# Patient Record
Sex: Female | Born: 1960 | Race: Black or African American | Hispanic: No | Marital: Married | Smoking: Never smoker
Health system: Southern US, Community
[De-identification: ages and names within clinical notes are randomized; demographics above are authoritative.]

## PROBLEM LIST (undated history)

## (undated) DIAGNOSIS — I10 Essential (primary) hypertension: Secondary | ICD-10-CM

## (undated) DIAGNOSIS — K219 Gastro-esophageal reflux disease without esophagitis: Secondary | ICD-10-CM

## (undated) DIAGNOSIS — J45909 Unspecified asthma, uncomplicated: Secondary | ICD-10-CM

## (undated) HISTORY — DX: Unspecified asthma, uncomplicated: J45.909

## (undated) HISTORY — DX: Essential (primary) hypertension: I10

## (undated) HISTORY — DX: Gastro-esophageal reflux disease without esophagitis: K21.9

---

## 2009-10-12 ENCOUNTER — Encounter: Admission: RE | Admit: 2009-10-12 | Discharge: 2009-10-12 | Payer: Self-pay | Admitting: Internal Medicine

## 2010-10-27 ENCOUNTER — Other Ambulatory Visit: Payer: Self-pay | Admitting: Internal Medicine

## 2010-10-27 DIAGNOSIS — Z1231 Encounter for screening mammogram for malignant neoplasm of breast: Secondary | ICD-10-CM

## 2010-11-02 ENCOUNTER — Ambulatory Visit
Admission: RE | Admit: 2010-11-02 | Discharge: 2010-11-02 | Disposition: A | Discharge status: Home / Self Care | Payer: 59 | Source: Ambulatory Visit | Attending: Internal Medicine | Admitting: Internal Medicine

## 2010-11-02 DIAGNOSIS — Z1231 Encounter for screening mammogram for malignant neoplasm of breast: Secondary | ICD-10-CM

## 2011-01-19 ENCOUNTER — Other Ambulatory Visit: Payer: Self-pay | Admitting: Internal Medicine

## 2011-01-19 DIAGNOSIS — K219 Gastro-esophageal reflux disease without esophagitis: Secondary | ICD-10-CM

## 2011-01-20 ENCOUNTER — Other Ambulatory Visit: Payer: 59

## 2011-01-28 ENCOUNTER — Ambulatory Visit
Admission: RE | Admit: 2011-01-28 | Discharge: 2011-01-28 | Disposition: A | Discharge status: Home / Self Care | Payer: 59 | Source: Ambulatory Visit | Attending: Internal Medicine | Admitting: Internal Medicine

## 2011-01-28 DIAGNOSIS — K219 Gastro-esophageal reflux disease without esophagitis: Secondary | ICD-10-CM

## 2011-11-15 ENCOUNTER — Other Ambulatory Visit: Payer: Self-pay | Admitting: Internal Medicine

## 2011-11-15 DIAGNOSIS — Z1231 Encounter for screening mammogram for malignant neoplasm of breast: Secondary | ICD-10-CM

## 2011-12-16 ENCOUNTER — Ambulatory Visit
Admission: RE | Admit: 2011-12-16 | Discharge: 2011-12-16 | Disposition: A | Discharge status: Home / Self Care | Payer: 59 | Source: Ambulatory Visit | Attending: Internal Medicine | Admitting: Internal Medicine

## 2011-12-16 DIAGNOSIS — Z1231 Encounter for screening mammogram for malignant neoplasm of breast: Secondary | ICD-10-CM

## 2012-01-05 ENCOUNTER — Ambulatory Visit (INDEPENDENT_AMBULATORY_CARE_PROVIDER_SITE_OTHER): Payer: Self-pay | Admitting: General Surgery

## 2012-01-10 ENCOUNTER — Encounter (INDEPENDENT_AMBULATORY_CARE_PROVIDER_SITE_OTHER): Payer: Self-pay | Admitting: General Surgery

## 2012-01-11 ENCOUNTER — Ambulatory Visit (INDEPENDENT_AMBULATORY_CARE_PROVIDER_SITE_OTHER): Payer: Self-pay | Admitting: General Surgery

## 2012-01-12 ENCOUNTER — Encounter (INDEPENDENT_AMBULATORY_CARE_PROVIDER_SITE_OTHER): Payer: Self-pay | Admitting: General Surgery

## 2012-01-12 ENCOUNTER — Ambulatory Visit (INDEPENDENT_AMBULATORY_CARE_PROVIDER_SITE_OTHER): Payer: 59 | Admitting: General Surgery

## 2012-01-12 VITALS — BP 126/84 | HR 76 | Temp 97.6°F | Resp 14 | Ht 64.0 in | Wt 150.0 lb

## 2012-01-12 DIAGNOSIS — K8020 Calculus of gallbladder without cholecystitis without obstruction: Secondary | ICD-10-CM

## 2012-01-12 NOTE — Patient Instructions (Signed)
Call if you start developing more frequent attacks/symptoms

## 2012-01-12 NOTE — Progress Notes (Signed)
Patient ID: Kelly Brewer Brewer, female   DOB: 1960-06-28, 51 y.o.   MRN: [id number]  Chief Complaint  Patient presents with  . Abdominal Pain    HPI Kelly Brewer Brewer is a 51 y.o. female.   HPI 51 yo female referred by Dr Concepcion Elk for evaluation of abdominal pain. The patient states that she has had intermittent epigastric pain on and off for quite some time. Several months ago, she states it was very intense. Occurring almost on a daily basis. The pain radiated to her back. She denies any fever, chills, nausea, vomiting, diarrhea or constipation. She denies any acholic stools. She denies any difficulty swallowing liquids or solids. She does have some reflux. She had a negative upper endoscopy. She denies any NSAID use. She states that she stopped taking a lot of medication and since then she has had very few attacks. Over the past month she really hasn't had any episodes. She has had one or 2 episodes where she had only some mild discomfort in her epigastrium that lasted for a few minutes. She saw her primary care physician recently and was complaining of some back pain in addition to the abdominal pain. She was given some muscle relaxant which has relieved her back pain.  She states that she will have the epigastric discomfort generally after eating foods. Past Medical History  Diagnosis Date  . GERD (gastroesophageal reflux disease)   . Hypertension   . Asthma     Past Surgical History  Procedure Date  . Cesarean section     No family history on file.  Social History History  Substance Use Topics  . Smoking status: Never Smoker   . Smokeless tobacco: Not on file  . Alcohol Use: No    No Known Allergies  Current Outpatient Prescriptions  Medication Sig Dispense Refill  . albuterol (PROVENTIL) (2.5 MG/3ML) 0.083% nebulizer solution Take 2.5 mg by nebulization every 6 (six) hours as needed.      . cyclobenzaprine (FLEXERIL) 5 MG tablet Take 5 mg by mouth 3 (three) times daily as  needed.      Marland Kitchen losartan (COZAAR) 100 MG tablet Take 100 mg by mouth daily.      . naproxen (NAPROSYN) 500 MG tablet Take 500 mg by mouth 2 (two) times daily with a meal.      . loratadine (CLARITIN) 10 MG tablet Take 10 mg by mouth daily.        Review of Systems Review of Systems  Constitutional: Negative for fever, activity change, appetite change and unexpected weight change.  HENT: Negative for hearing loss and neck pain.   Eyes: Negative for photophobia, discharge and visual disturbance.  Respiratory: Negative for chest tightness and shortness of breath.   Cardiovascular: Negative for chest pain, palpitations and leg swelling.  Gastrointestinal:       See hpi  Genitourinary: Negative for dysuria, hematuria, difficulty urinating and genital sores.  Musculoskeletal: Positive for back pain. Negative for joint swelling and arthralgias.  Skin: Negative for color change, pallor and rash.  Neurological: Negative for dizziness, tremors, seizures, weakness, light-headedness and headaches.  Hematological: Negative for adenopathy. Does not bruise/bleed easily.  Psychiatric/Behavioral: Negative for behavioral problems. The patient is not nervous/anxious and is not hyperactive.     Blood pressure 126/84, pulse 76, temperature 97.6 F (36.4 C), resp. rate 14, height 5\' 4"  (1.626 m), weight 150 lb (68.04 kg).  Physical Exam Physical Exam  Vitals reviewed. Constitutional: She is oriented to person, place, and  time. She appears well-developed and well-nourished. No distress.  HENT:  Head: Normocephalic and atraumatic.  Right Ear: External ear normal.  Left Ear: External ear normal.  Eyes: Conjunctivae normal are normal. No scleral icterus.  Neck: Normal range of motion. Neck supple. No tracheal deviation present. No thyromegaly present.  Cardiovascular: Normal rate, regular rhythm and normal heart sounds.   Pulmonary/Chest: Effort normal and breath sounds normal. No respiratory distress. She  has no wheezes.  Abdominal: Soft. Bowel sounds are normal. She exhibits no distension. There is no tenderness. There is no rebound and no guarding.    Musculoskeletal: She exhibits no edema and no tenderness.  Neurological: She is alert and oriented to person, place, and time.  Skin: Skin is warm and dry. No rash noted. She is not diaphoretic. No erythema.  Psychiatric: She has a normal mood and affect. Her behavior is normal. Thought content normal.    Data Reviewed -Referring provider's note from 12/2011 -Dr Haywood Pao brief note to PCP in 02/2011 - nml EGD  COMPLETE ABDOMINAL ULTRASOUND 2012 Comparison: None.  Findings:  Gallbladder: There are multiple mobile echogenic layering stones  with an otherwise normal-appearing gallbladder. No gallbladder  wall thickening or pericholecystic fluid. Negative sonographic  Murphy's sign.  Common bile duct: Normal in size measuring 3.2 mm in diameter.  Liver: Homogeneous hepatic echotexture. No discrete hepatic  lesions. No ascites.  IVC: Appears normal.  Pancreas: There is mild heterogeneity of the pancreatic parenchyma  without discrete lesion.  Spleen: Normal in size measuring 5.2 mm in diameter.  Right Kidney: Normal cortical thickness, echogenicity and size,  measuring 9.5 cm in length. No focal renal lesions. No echogenic  renal stones. No urinary obstruction.  Left Kidney: Normal cortical thickness, echogenicity and size,  measuring 10.4 cm in length. No focal renal lesions. No echogenic  renal stones. No urinary obstruction.  Abdominal aorta: No aneurysm identified.   IMPRESSION:  Cholelithiasis without evidence of cholecystitis. Further  evaluation may be obtained with a HIDA scan as clinically  indicated.   Assessment    Symptomatic cholelithiasis    Plan    I believe the patient's symptoms are consistent with gallbladder disease.  We discussed gallbladder disease. The patient was given Agricultural engineer. We discussed  non-operative and operative management. We discussed the signs & symptoms of acute cholecystitis  I discussed laparoscopic cholecystectomy with IOC in detail.  The patient was given educational material as well as diagrams detailing the procedure.  We discussed the risks and benefits of a laparoscopic cholecystectomy including, but not limited to bleeding, infection, injury to surrounding structures such as the intestine or liver, bile leak, retained gallstones, need to convert to an open procedure, prolonged diarrhea, blood clots such as  DVT, common bile duct injury, anesthesia risks, and possible need for additional procedures.  We discussed the typical post-operative recovery course. I explained that the likelihood of improvement of their symptoms is good.  The patient has elected NOT to proceed with Laparoscopic Cholecystectomy at this time. They want to wait until she starts having more frequent attacks. I encouraged them to call us if she starts developing more frequent attacks. I also explained that the risks of complications is increased when the gallbladder is inflammed. We rediscussed signs & symptoms of acute cholecystitis.   F/u PRN  Kelly Brewer Brewer. Andrey Campanile, MD, FACS General, Bariatric, & Minimally Invasive Surgery [facility] Surgery, [place]         [facility] M 01/12/2012, 2:27 PM

## 2012-01-18 ENCOUNTER — Encounter (INDEPENDENT_AMBULATORY_CARE_PROVIDER_SITE_OTHER): Payer: Self-pay

## 2012-06-06 ENCOUNTER — Encounter (INDEPENDENT_AMBULATORY_CARE_PROVIDER_SITE_OTHER): Payer: Self-pay

## 2012-11-09 ENCOUNTER — Other Ambulatory Visit: Payer: Self-pay

## 2012-11-09 DIAGNOSIS — Z1231 Encounter for screening mammogram for malignant neoplasm of breast: Secondary | ICD-10-CM

## 2012-11-22 ENCOUNTER — Encounter (INDEPENDENT_AMBULATORY_CARE_PROVIDER_SITE_OTHER): Payer: Self-pay | Admitting: General Surgery

## 2012-11-22 ENCOUNTER — Ambulatory Visit (INDEPENDENT_AMBULATORY_CARE_PROVIDER_SITE_OTHER): Payer: 59 | Admitting: General Surgery

## 2012-11-22 VITALS — BP 180/100 | HR 72 | Temp 98.7°F | Resp 14 | Ht 64.0 in | Wt 155.7 lb

## 2012-11-22 DIAGNOSIS — K8020 Calculus of gallbladder without cholecystitis without obstruction: Secondary | ICD-10-CM

## 2012-11-22 NOTE — Progress Notes (Signed)
Patient ID: Kelly Brewer, female   DOB: Jan 27, 1961, 52 y.o.   MRN: [id number]  Chief Complaint  Patient presents with  . Follow-up    LTFU reck gb and discuss sx    HPI Kelly Brewer is a 52 y.o. female.   HPI 52 year old African female comes back in because of ongoing epigastric abdominal pain radiating to her back and shoulder. I initially saw her last December for symptomatic cholelithiasis. She had a normal upper endoscopy. At that time her symptoms and her attacks were infrequent and they decided to do diet modification. She reports recently that her episodes are coming more frequently. She states that she has an attack about every 2 days. It will last for about 30 minutes to over an hour. She describes it as a sharp pain that occurs in her upper abdomen radiating to her back. She denies any nausea or vomiting. She does endorse some bloating. She denies any fevers or chills. She denies any diarrhea or constipation. She denies any NSAID use. She tried taking Nexium without any relief. She stopped taking her blood pressure medicine several weeks ago.  Past Medical History  Diagnosis Date  . GERD (gastroesophageal reflux disease)   . Hypertension   . Asthma     Past Surgical History  Procedure Laterality Date  . Cesarean section      History reviewed. No pertinent family history.  Social History History  Substance Use Topics  . Smoking status: Never Smoker   . Smokeless tobacco: Never Used  . Alcohol Use: No    No Known Allergies  Current Outpatient Prescriptions  Medication Sig Dispense Refill  . albuterol (PROVENTIL) (2.5 MG/3ML) 0.083% nebulizer solution Take 2.5 mg by nebulization every 6 (six) hours as needed.      Marland Kitchen NEXIUM 40 MG capsule        No current facility-administered medications for this visit.    Review of Systems Review of Systems  Constitutional: Negative for fever, activity change, appetite change and unexpected weight change.  HENT:  Negative for nosebleeds and trouble swallowing.   Eyes: Negative for photophobia and visual disturbance.  Respiratory: Negative for chest tightness and shortness of breath.   Cardiovascular: Negative for chest pain and leg swelling.       Denies CP, SOB, orthopnea, PND, DOE  Gastrointestinal:       See HPI  Genitourinary: Negative for dysuria and difficulty urinating.  Musculoskeletal: Negative for arthralgias.  Skin: Negative for pallor and rash.  Neurological: Negative for dizziness, seizures, facial asymmetry and numbness.       Denies TIA and amaurosis fugax   Hematological: Negative for adenopathy. Does not bruise/bleed easily.  Psychiatric/Behavioral: Negative for behavioral problems and agitation.    Blood pressure 180/100, pulse 72, temperature 98.7 F (37.1 C), temperature source Temporal, resp. rate 14, height 5\' 4"  (1.626 m), weight 155 lb 10.6 oz (70.607 kg).  Physical Exam Physical Exam  Vitals reviewed. Constitutional: She is oriented to person, place, and time. She appears well-developed and well-nourished. No distress.  HENT:  Head: Normocephalic and atraumatic.  Right Ear: External ear normal.  Left Ear: External ear normal.  Eyes: Conjunctivae are normal. No scleral icterus.  Neck: Normal range of motion. Neck supple. No tracheal deviation present. No thyromegaly present.  Cardiovascular: Normal rate and normal heart sounds.   Pulmonary/Chest: Effort normal and breath sounds normal. No stridor. No respiratory distress. She has no wheezes.  Abdominal: Soft. She exhibits no distension. There is no  tenderness. There is no rebound and no guarding.  Musculoskeletal: She exhibits no edema and no tenderness.  Lymphadenopathy:    She has no cervical adenopathy.  Neurological: She is alert and oriented to person, place, and time. She exhibits normal muscle tone.  Skin: Skin is warm and dry. No rash noted. She is not diaphoretic. No erythema.  Psychiatric: She has a  normal mood and affect. Her behavior is normal. Judgment and thought content normal.  She deferred to her husband    Data Reviewed My office note from 01/2012 U/s report  Assessment    Symptomatic cholelithiasis HTN     Plan    First, I informed that she must restart her blood pressure medication. We discussed the dangers of untreated HTN. She has no symptoms of HA, vision changes.   I believe the patient's symptoms are consistent with gallbladder disease.  We rediscussed gallbladder disease. The patient was given Agricultural engineer. We rediscussed non-operative and operative management. We discussed the signs & symptoms of acute cholecystitis  I rediscussed laparoscopic cholecystectomy with possible IOC in detail.  The patient was given educational material as well as diagrams detailing the procedure.  We discussed the risks and benefits of a laparoscopic cholecystectomy including, but not limited to bleeding, infection, injury to surrounding structures such as the intestine or liver, bile leak, retained gallstones, need to convert to an open procedure, prolonged diarrhea, blood clots such as  DVT, common bile duct injury, anesthesia risks, and possible need for additional procedures.  We discussed the typical post-operative recovery course. I explained that the likelihood of improvement of their symptoms is good.  They have elected to proceed with surgery  Mary Sella. Andrey Campanile, MD, FACS General, Bariatric, & Minimally Invasive Surgery [facility] Surgery, [place]          [facility] M 11/22/2012, 3:56 PM

## 2012-11-22 NOTE — Patient Instructions (Signed)
Resume your blood pressure medication  Avoid fried, greasy foods  Cholelithiasis Cholelithiasis (also called gallstones) is a form of gallbladder disease where gallstones form in your gallbladder. The gallbladder is a non-essential organ that stores bile made in the liver, which helps digest fats. Gallstones begin as small crystals and slowly grow into stones. Gallstone pain occurs when the gallbladder spasms, and a gallstone is blocking the duct. Pain can also occur when a stone passes out of the duct.  Women are more likely to develop gallstones than men. Other factors that increase the risk of gallbladder disease are:  Having multiple pregnancies. Physicians sometimes advise removing diseased gallbladders before future pregnancies.  Obesity.  Diets heavy in fried foods and fat.  Increasing age (older than 30).  Prolonged use of medications containing female hormones.  Diabetes mellitus.  Rapid weight loss.  Family history of gallstones (heredity). SYMPTOMS  Feeling sick to your stomach (nauseous).  Abdominal pain.  Yellowing of the skin (jaundice).  Sudden pain. It may persist from several minutes to several hours.  Worsening pain with deep breathing or when jarred.  Fever.  Tenderness to the touch. In some cases, when gallstones do not move into the bile duct, people have no pain or symptoms. These are called "silent" gallstones. TREATMENT In severe cases, emergency surgery may be required. HOME CARE INSTRUCTIONS   Only take over-the-counter or prescription medicines for pain, discomfort, or fever as directed by your caregiver.  Follow a low-fat diet until seen again. Fat causes the gallbladder to contract, which can result in pain.  Follow up as instructed. Attacks are almost always recurrent and surgery is usually required for permanent treatment. SEEK IMMEDIATE MEDICAL CARE IF:   Your pain increases and is not controlled by medications.  You have an oral  temperature above 102 F (38.9 C), not controlled by medication.  You develop nausea and vomiting. MAKE SURE YOU:   Understand these instructions.  Will watch your condition.  Will get help right away if you are not doing well or get worse. Document Released: 01/13/2005 Document Revised: 04/11/2011 Document Reviewed: 03/18/2010 [facility] Patient Information 2014 [place], [place].

## 2012-12-17 ENCOUNTER — Ambulatory Visit
Admission: RE | Admit: 2012-12-17 | Discharge: 2012-12-17 | Disposition: A | Discharge status: Home / Self Care | Payer: 59 | Source: Ambulatory Visit

## 2012-12-17 DIAGNOSIS — Z1231 Encounter for screening mammogram for malignant neoplasm of breast: Secondary | ICD-10-CM

## 2012-12-31 ENCOUNTER — Encounter (HOSPITAL_COMMUNITY): Payer: Self-pay

## 2012-12-31 ENCOUNTER — Encounter (HOSPITAL_COMMUNITY)
Admission: RE | Admit: 2012-12-31 | Discharge: 2012-12-31 | Disposition: A | Discharge status: Home / Self Care | Payer: 59 | Source: Ambulatory Visit | Attending: General Surgery | Admitting: General Surgery

## 2012-12-31 DIAGNOSIS — Z01818 Encounter for other preprocedural examination: Secondary | ICD-10-CM | POA: Insufficient documentation

## 2012-12-31 DIAGNOSIS — Z01812 Encounter for preprocedural laboratory examination: Secondary | ICD-10-CM | POA: Insufficient documentation

## 2012-12-31 LAB — CBC WITH DIFFERENTIAL/PLATELET
Eosinophils Absolute: 0.4 10*3/uL (ref 0.0–0.7)
Eosinophils Relative: 7 % — ABNORMAL HIGH (ref 0–5)
HCT: 38.2 % (ref 36.0–46.0)
Lymphocytes Relative: 46 % (ref 12–46)
Lymphs Abs: 2.5 10*3/uL (ref 0.7–4.0)
MCH: 26.9 pg (ref 26.0–34.0)
MCV: 77.3 fL — ABNORMAL LOW (ref 78.0–100.0)
Monocytes Absolute: 0.4 10*3/uL (ref 0.1–1.0)
Platelets: 211 10*3/uL (ref 150–400)
RBC: 4.94 MIL/uL (ref 3.87–5.11)
RDW: 13 % (ref 11.5–15.5)
WBC: 5.4 10*3/uL (ref 4.0–10.5)

## 2012-12-31 LAB — COMPREHENSIVE METABOLIC PANEL
CO2: 27 mEq/L (ref 19–32)
Calcium: 9.5 mg/dL (ref 8.4–10.5)
Creatinine, Ser: 0.92 mg/dL (ref 0.50–1.10)
GFR calc Af Amer: 83 mL/min — ABNORMAL LOW (ref >90)
GFR calc non Af Amer: 71 mL/min — ABNORMAL LOW (ref >90)
Glucose, Bld: 78 mg/dL (ref 70–99)
Total Protein: 7.9 g/dL (ref 6.0–8.3)

## 2012-12-31 LAB — HCG, SERUM, QUALITATIVE: Preg, Serum: NEGATIVE

## 2012-12-31 NOTE — Progress Notes (Addendum)
req'd ekg,cxr from pcp dr [facility]

## 2012-12-31 NOTE — Pre-Procedure Instructions (Addendum)
ABBAGAIL SCAFF  12/31/2012   Your procedure is scheduled on:  01/11/13  Report to [facility] short stay admitting at 800 AM.  Call this number if you have problems the morning of surgery: [phone]   Remember:   Do not eat food or drink liquids after midnight.   Take these medicines the morning of surgery with A SIP water, inhaler if needed              STOP all herbel meds, nsaids (aleve,naproxen,advil,ibuprofen) 5 days prior to surgery including aspirin   Do not wear jewelry, make-up or nail polish.  Do not wear lotions, powders, or perfumes. You may wear deodorant.  Do not shave 48 hours prior to surgery. Men may shave face and neck.  Do not bring valuables to the hospital.  [facility] is not responsible                  for any belongings or valuables.               Contacts, dentures or bridgework may not be worn into surgery.  Leave suitcase in the car. After surgery it may be brought to your room.  For patients admitted to the hospital, discharge time is determined by your                treatment team.               Patients discharged the day of surgery will not be allowed to drive  home.  Name and phone number of your driver:   Special Instructions: Shower using CHG 2 nights before surgery and the night before surgery.  If you shower the day of surgery use CHG.  Use special wash - you have one bottle of CHG for all showers.  You should use approximately 1/3 of the bottle for each shower.   Please read over the following fact sheets that you were given: Pain Booklet, Coughing and Deep Breathing and Surgical Site Infection Prevention

## 2013-01-10 MED ORDER — CEFOXITIN SODIUM 2 G IV SOLR
2.0000 g | INTRAVENOUS | Status: AC
  Administered 2013-01-11: 2 g via INTRAVENOUS
  Filled 2013-01-10: qty 2

## 2013-01-11 ENCOUNTER — Encounter (HOSPITAL_COMMUNITY)
Admission: RE | Disposition: A | Discharge status: Home / Self Care | Payer: Self-pay | Source: Ambulatory Visit | Attending: General Surgery

## 2013-01-11 ENCOUNTER — Encounter (HOSPITAL_COMMUNITY): Payer: Self-pay | Admitting: Certified Registered"

## 2013-01-11 ENCOUNTER — Encounter (HOSPITAL_COMMUNITY): Payer: 59 | Admitting: Certified Registered"

## 2013-01-11 ENCOUNTER — Ambulatory Visit (HOSPITAL_COMMUNITY): Payer: 59 | Admitting: Certified Registered"

## 2013-01-11 ENCOUNTER — Ambulatory Visit (HOSPITAL_COMMUNITY): Payer: 59

## 2013-01-11 ENCOUNTER — Ambulatory Visit (HOSPITAL_COMMUNITY)
Admission: RE | Admit: 2013-01-11 | Discharge: 2013-01-11 | Disposition: A | Discharge status: Home / Self Care | Payer: 59 | Source: Ambulatory Visit | Attending: General Surgery | Admitting: General Surgery

## 2013-01-11 DIAGNOSIS — K8010 Calculus of gallbladder with chronic cholecystitis without obstruction: Secondary | ICD-10-CM

## 2013-01-11 DIAGNOSIS — I10 Essential (primary) hypertension: Secondary | ICD-10-CM | POA: Insufficient documentation

## 2013-01-11 HISTORY — PX: CHOLECYSTECTOMY: SHX55

## 2013-01-11 SURGERY — LAPAROSCOPIC CHOLECYSTECTOMY WITH INTRAOPERATIVE CHOLANGIOGRAM
Anesthesia: General | Site: Abdomen

## 2013-01-11 MED ORDER — KETOROLAC TROMETHAMINE 30 MG/ML IJ SOLN
INTRAMUSCULAR | Status: AC
  Administered 2013-01-11: 30 mg via INTRAVENOUS
  Filled 2013-01-11: qty 1

## 2013-01-11 MED ORDER — LIDOCAINE HCL (CARDIAC) 20 MG/ML IV SOLN
INTRAVENOUS | Status: DC | PRN
  Administered 2013-01-11: 80 mg via INTRAVENOUS

## 2013-01-11 MED ORDER — HYDROMORPHONE HCL PF 1 MG/ML IJ SOLN
INTRAMUSCULAR | Status: AC
  Filled 2013-01-11: qty 1

## 2013-01-11 MED ORDER — ONDANSETRON HCL 4 MG/2ML IJ SOLN: 4.0000 mg | Freq: Four times a day (QID) | INTRAMUSCULAR | Status: DC | PRN

## 2013-01-11 MED ORDER — ROCURONIUM BROMIDE 100 MG/10ML IV SOLN
INTRAVENOUS | Status: DC | PRN
  Administered 2013-01-11: 50 mg via INTRAVENOUS

## 2013-01-11 MED ORDER — NEOSTIGMINE METHYLSULFATE 1 MG/ML IJ SOLN
INTRAMUSCULAR | Status: DC | PRN
  Administered 2013-01-11: 1 mg via INTRAVENOUS
  Administered 2013-01-11: 4 mg via INTRAVENOUS

## 2013-01-11 MED ORDER — SIMETHICONE 40 MG/0.6ML PO SUSP
40.0000 mg | Freq: Once | ORAL | Status: DC
  Filled 2013-01-11: qty 0.6

## 2013-01-11 MED ORDER — GLYCOPYRROLATE 0.2 MG/ML IJ SOLN
INTRAMUSCULAR | Status: DC | PRN
  Administered 2013-01-11: 0.6 mg via INTRAVENOUS
  Administered 2013-01-11: 0.2 mg via INTRAVENOUS

## 2013-01-11 MED ORDER — FENTANYL CITRATE 0.05 MG/ML IJ SOLN
INTRAMUSCULAR | Status: DC | PRN
  Administered 2013-01-11: 100 ug via INTRAVENOUS
  Administered 2013-01-11: 50 ug via INTRAVENOUS

## 2013-01-11 MED ORDER — OXYCODONE HCL 5 MG/5ML PO SOLN: 5.0000 mg | Freq: Once | ORAL | Status: DC | PRN

## 2013-01-11 MED ORDER — ONDANSETRON HCL 4 MG/2ML IJ SOLN
INTRAMUSCULAR | Status: DC | PRN
  Administered 2013-01-11: 4 mg via INTRAVENOUS

## 2013-01-11 MED ORDER — KETOROLAC TROMETHAMINE 30 MG/ML IJ SOLN
30.0000 mg | Freq: Four times a day (QID) | INTRAMUSCULAR | Status: DC
  Administered 2013-01-11: 30 mg via INTRAVENOUS
  Filled 2013-01-11 (×3): qty 1

## 2013-01-11 MED ORDER — MIDAZOLAM HCL 5 MG/5ML IJ SOLN
INTRAMUSCULAR | Status: DC | PRN
  Administered 2013-01-11 (×2): 2 mg via INTRAVENOUS

## 2013-01-11 MED ORDER — ESMOLOL HCL 10 MG/ML IV SOLN
INTRAVENOUS | Status: DC | PRN
  Administered 2013-01-11: 20 mg via INTRAVENOUS

## 2013-01-11 MED ORDER — HYDROMORPHONE HCL PF 1 MG/ML IJ SOLN
INTRAMUSCULAR | Status: AC
  Administered 2013-01-11: 0.25 mg via INTRAVENOUS
  Filled 2013-01-11: qty 1

## 2013-01-11 MED ORDER — PROPOFOL 10 MG/ML IV BOLUS
INTRAVENOUS | Status: DC | PRN
  Administered 2013-01-11: 190 mg via INTRAVENOUS

## 2013-01-11 MED ORDER — HYDROMORPHONE HCL PF 1 MG/ML IJ SOLN
0.2500 mg | INTRAMUSCULAR | Status: DC | PRN
  Administered 2013-01-11 (×2): 0.25 mg via INTRAVENOUS

## 2013-01-11 MED ORDER — OXYCODONE-ACETAMINOPHEN 5-325 MG PO TABS: 1.0000 | ORAL_TABLET | ORAL | Status: DC | PRN

## 2013-01-11 MED ORDER — 0.9 % SODIUM CHLORIDE (POUR BTL) OPTIME
TOPICAL | Status: DC | PRN
  Administered 2013-01-11: 1000 mL

## 2013-01-11 MED ORDER — LACTATED RINGERS IV SOLN
INTRAVENOUS | Status: DC
  Administered 2013-01-11 (×2): via INTRAVENOUS

## 2013-01-11 MED ORDER — OXYCODONE HCL 5 MG PO TABS: 5.0000 mg | ORAL_TABLET | Freq: Once | ORAL | Status: DC | PRN

## 2013-01-11 MED ORDER — PHENYLEPHRINE HCL 10 MG/ML IJ SOLN
INTRAMUSCULAR | Status: DC | PRN
  Administered 2013-01-11 (×5): 80 ug via INTRAVENOUS
  Administered 2013-01-11: 120 ug via INTRAVENOUS
  Administered 2013-01-11: 40 ug via INTRAVENOUS
  Administered 2013-01-11: 120 ug via INTRAVENOUS

## 2013-01-11 MED ORDER — SODIUM CHLORIDE 0.9 % IR SOLN
Status: DC | PRN
  Administered 2013-01-11: 1000 mL

## 2013-01-11 MED ORDER — BUPIVACAINE-EPINEPHRINE (PF) 0.25% -1:200000 IJ SOLN
INTRAMUSCULAR | Status: AC
  Filled 2013-01-11: qty 30

## 2013-01-11 MED ORDER — BUPIVACAINE-EPINEPHRINE 0.25% -1:200000 IJ SOLN
INTRAMUSCULAR | Status: DC | PRN
  Administered 2013-01-11: 22 mL

## 2013-01-11 SURGICAL SUPPLY — 47 items
APPLIER CLIP 5 13 M/L LIGAMAX5 (MISCELLANEOUS) ×2
BANDAGE ADHESIVE 1X3 (GAUZE/BANDAGES/DRESSINGS) ×6 IMPLANT
BENZOIN TINCTURE PRP APPL 2/3 (GAUZE/BANDAGES/DRESSINGS) ×2 IMPLANT
BLADE SURG ROTATE 9660 (MISCELLANEOUS) IMPLANT
CANISTER SUCTION 2500CC (MISCELLANEOUS) ×2 IMPLANT
CHLORAPREP W/TINT 26ML (MISCELLANEOUS) ×2 IMPLANT
CLIP APPLIE 5 13 M/L LIGAMAX5 (MISCELLANEOUS) ×1 IMPLANT
COVER MAYO STAND STRL (DRAPES) ×2 IMPLANT
COVER SURGICAL LIGHT HANDLE (MISCELLANEOUS) ×2 IMPLANT
DECANTER SPIKE VIAL GLASS SM (MISCELLANEOUS) ×2 IMPLANT
DRAPE C-ARM 42X72 X-RAY (DRAPES) ×2 IMPLANT
DRAPE UTILITY 15X26 W/TAPE STR (DRAPE) ×4 IMPLANT
DRSG TEGADERM 4X4.75 (GAUZE/BANDAGES/DRESSINGS) ×2 IMPLANT
ELECT REM PT RETURN 9FT ADLT (ELECTROSURGICAL) ×2
ELECTRODE REM PT RTRN 9FT ADLT (ELECTROSURGICAL) ×1 IMPLANT
GAUZE SPONGE 2X2 8PLY STRL LF (GAUZE/BANDAGES/DRESSINGS) ×1 IMPLANT
GLOVE BIOGEL M STRL SZ7.5 (GLOVE) ×2 IMPLANT
GLOVE BIOGEL PI IND STRL 7.0 (GLOVE) ×1 IMPLANT
GLOVE BIOGEL PI IND STRL 8 (GLOVE) ×1 IMPLANT
GLOVE BIOGEL PI INDICATOR 7.0 (GLOVE) ×1
GLOVE BIOGEL PI INDICATOR 8 (GLOVE) ×1
GLOVE SS BIOGEL STRL SZ 6 (GLOVE) ×1 IMPLANT
GLOVE SS BIOGEL STRL SZ 7 (GLOVE) ×1 IMPLANT
GLOVE SUPERSENSE BIOGEL SZ 6 (GLOVE) ×1
GLOVE SUPERSENSE BIOGEL SZ 7 (GLOVE) ×1
GLOVE SURG SS PI 7.0 STRL IVOR (GLOVE) ×2 IMPLANT
GOWN STRL NON-REIN LRG LVL3 (GOWN DISPOSABLE) ×4 IMPLANT
GOWN STRL REIN XL XLG (GOWN DISPOSABLE) ×2 IMPLANT
KIT BASIN OR (CUSTOM PROCEDURE TRAY) ×2 IMPLANT
KIT ROOM TURNOVER OR (KITS) ×2 IMPLANT
NS IRRIG 1000ML POUR BTL (IV SOLUTION) ×2 IMPLANT
PAD ARMBOARD 7.5X6 YLW CONV (MISCELLANEOUS) ×2 IMPLANT
POUCH SPECIMEN RETRIEVAL 10MM (ENDOMECHANICALS) ×2 IMPLANT
SCISSORS LAP 5X35 DISP (ENDOMECHANICALS) ×2 IMPLANT
SET CHOLANGIOGRAPH 5 50 .035 (SET/KITS/TRAYS/PACK) ×4 IMPLANT
SET IRRIG TUBING LAPAROSCOPIC (IRRIGATION / IRRIGATOR) ×2 IMPLANT
SLEEVE ENDOPATH XCEL 5M (ENDOMECHANICALS) ×4 IMPLANT
SPECIMEN JAR SMALL (MISCELLANEOUS) ×2 IMPLANT
SPONGE GAUZE 2X2 STER 10/PKG (GAUZE/BANDAGES/DRESSINGS) ×1
SUT MNCRL AB 4-0 PS2 18 (SUTURE) ×2 IMPLANT
SUT SILK 3 0 SH CR/8 (SUTURE) ×2 IMPLANT
SUT VICRYL 0 UR6 27IN ABS (SUTURE) ×4 IMPLANT
TOWEL OR 17X24 6PK STRL BLUE (TOWEL DISPOSABLE) ×2 IMPLANT
TOWEL OR 17X26 10 PK STRL BLUE (TOWEL DISPOSABLE) ×2 IMPLANT
TRAY LAPAROSCOPIC (CUSTOM PROCEDURE TRAY) ×2 IMPLANT
TROCAR XCEL BLUNT TIP 100MML (ENDOMECHANICALS) ×2 IMPLANT
TROCAR XCEL NON-BLD 5MMX100MML (ENDOMECHANICALS) ×2 IMPLANT

## 2013-01-11 NOTE — Progress Notes (Signed)
Attempted to call report to Short stay, pt stable and ready to be transferred to short stay

## 2013-01-11 NOTE — Anesthesia Procedure Notes (Signed)
Procedure Name: Intubation Date/Time: 01/11/2013 9:42 AM Performed by: Jerilee Hoh Pre-anesthesia Checklist: Patient identified, Emergency Drugs available, Suction available and Patient being monitored Patient Re-evaluated:Patient Re-evaluated prior to inductionOxygen Delivery Method: Circle system utilized Preoxygenation: Pre-oxygenation with 100% oxygen Intubation Type: IV induction Ventilation: Mask ventilation without difficulty Laryngoscope Size: Mac and 3 Grade View: Grade I Tube type: Oral Tube size: 7.0 mm Number of attempts: 1 Airway Equipment and Method: Stylet Placement Confirmation: ETT inserted through vocal cords under direct vision,  positive ETCO2 and breath sounds checked- equal and bilateral Secured at: 22 cm Tube secured with: Tape Dental Injury: Teeth and Oropharynx as per pre-operative assessment

## 2013-01-11 NOTE — Progress Notes (Signed)
Attempted to call report to Short stay again, still not able to take patient at this time, pt resting in bed, will cont to assess

## 2013-01-11 NOTE — Progress Notes (Signed)
Report called to short stay Tanya RN, transferring patient to [facility] via strecher, husband at bedside

## 2013-01-11 NOTE — Preoperative (Signed)
Beta Blockers   Reason not to administer Beta Blockers:Not Applicable 

## 2013-01-11 NOTE — H&P (Signed)
Kelly Brewer is an 52 y.o. female.   Chief Complaint: abdominal pain, here for surgery HPI: 52 yo African female here for surgery  In review, 52 year old African female comes back in because of ongoing epigastric abdominal pain radiating to her back and shoulder. I initially saw her last December for symptomatic cholelithiasis. She had a normal upper endoscopy. At that time her symptoms and her attacks were infrequent and they decided to do diet modification. She reports recently that her episodes are coming more frequently. She states that she has an attack about every 2 days. It will last for about 30 minutes to over an hour. She describes it as a sharp pain that occurs in her upper abdomen radiating to her back. She denies any nausea or vomiting. She does endorse some bloating. She denies any fevers or chills. She denies any diarrhea or constipation. She denies any NSAID use. She tried taking Nexium without any relief. She stopped taking her blood pressure medicine several weeks ago.   Past Medical History  Diagnosis Date  . GERD (gastroesophageal reflux disease)   . Hypertension   . Asthma     Past Surgical History  Procedure Laterality Date  . Cesarean section      History reviewed. No pertinent family history. Social History:  reports that she has never smoked. She has never used smokeless tobacco. She reports that she does not drink alcohol or use illicit drugs.  Allergies:  Allergies  Allergen Reactions  . Other Other (See Comments)    AGENT: Camaquin REACTION: Nausea, weakness    Medications Prior to Admission  Medication Sig Dispense Refill  . amLODipine (NORVASC) 10 MG tablet Take 10 mg by mouth daily.      Marland Kitchen etonogestrel (IMPLANON) 68 MG IMPL implant Inject 1 each into the skin once.      Marland Kitchen losartan (COZAAR) 100 MG tablet Take 100 mg by mouth daily.      Marland Kitchen albuterol (PROVENTIL HFA;VENTOLIN HFA) 108 (90 BASE) MCG/ACT inhaler Inhale 2 puffs into the lungs every 6  (six) hours as needed for wheezing or shortness of breath.        No results found for this or any previous visit (from the past 48 hour(s)). No results found.  Review of Systems  Constitutional: Negative for weight loss.  HENT: Negative for nosebleeds.   Eyes: Negative for blurred vision.  Respiratory: Negative for shortness of breath.   Cardiovascular: Negative for chest pain, palpitations, orthopnea and PND.       Denies DOE  Gastrointestinal: Positive for nausea and abdominal pain. Negative for diarrhea and constipation.  Genitourinary: Negative for dysuria and hematuria.  Musculoskeletal: Negative.   Skin: Negative for itching and rash.  Neurological: Negative for dizziness, focal weakness, seizures, loss of consciousness and headaches.       Denies TIAs, amaurosis fugax  Endo/Heme/Allergies: Does not bruise/bleed easily.  Psychiatric/Behavioral: The patient is not nervous/anxious.     Blood pressure 145/98, pulse 87, temperature 98.7 F (37.1 C), temperature source Oral, resp. rate 18, last menstrual period 12/12/2012, SpO2 100.00%. Physical Exam  Vitals reviewed. Constitutional: She is oriented to person, place, and time. She appears well-developed and well-nourished. No distress.  HENT:  Head: Normocephalic and atraumatic.  Right Ear: External ear normal.  Left Ear: External ear normal.  Eyes: Conjunctivae are normal. No scleral icterus.  Neck: Normal range of motion. No tracheal deviation present.  Cardiovascular: Normal rate and normal heart sounds.   Respiratory: Effort normal. No  stridor. No respiratory distress. She has no wheezes.  GI: Soft. She exhibits no distension. There is no tenderness.  Musculoskeletal: She exhibits no edema.  Neurological: She is alert and oriented to person, place, and time.  Skin: Skin is warm and dry. No rash noted. She is not diaphoretic. No erythema. No pallor.  Psychiatric: She has a normal mood and affect. Her behavior is normal.  Judgment and thought content normal.     Assessment/Plan Symptomatic cholelithiaisis  To or for Lap chole with IOC All questions asked and answered. Watched EMMI video  Mary Sella. Andrey Campanile, MD, FACS General, Bariatric, & Minimally Invasive Surgery [facility] Surgery, [place]   [facility] M 01/11/2013, 9:30 AM

## 2013-01-11 NOTE — Anesthesia Preprocedure Evaluation (Signed)
Anesthesia Evaluation  Patient identified by MRN, date of birth, ID band Patient awake    Reviewed: Allergy & Precautions, H&P , NPO status , Patient's Chart, lab work & pertinent test results  Airway Mallampati: II  Neck ROM: full    Dental   Pulmonary asthma ,          Cardiovascular hypertension,     Neuro/Psych    GI/Hepatic GERD-  ,  Endo/Other    Renal/GU      Musculoskeletal   Abdominal   Peds  Hematology   Anesthesia Other Findings   Reproductive/Obstetrics                           Anesthesia Physical Anesthesia Plan  ASA: II  Anesthesia Plan: General   Post-op Pain Management:    Induction: Intravenous  Airway Management Planned: Oral ETT  Additional Equipment:   Intra-op Plan:   Post-operative Plan: Extubation in OR  Informed Consent: I have reviewed the patients History and Physical, chart, labs and discussed the procedure including the risks, benefits and alternatives for the proposed anesthesia with the patient or authorized representative who has indicated his/her understanding and acceptance.     Plan Discussed with: CRNA, Anesthesiologist and Surgeon  Anesthesia Plan Comments:         Anesthesia Quick Evaluation

## 2013-01-11 NOTE — Op Note (Signed)
Laparoscopic Cholecystectomy Procedure Note  Indications: This patient presents with symptomatic gallbladder disease and will undergo laparoscopic cholecystectomy.  Pre-operative Diagnosis: Calculus of gallbladder with other cholecystitis, without mention of obstruction  Post-operative Diagnosis: Same  Surgeon: Atilano Ina   Assistants: none  Anesthesia: General endotracheal anesthesia  ASA Class: 2  Procedure Details  The patient was seen again in the Holding Room. The risks, benefits, complications, treatment options, and expected outcomes were discussed with the patient. The possibilities of reaction to medication, pulmonary aspiration, perforation of viscus, bleeding, recurrent infection, finding a normal gallbladder, the need for additional procedures, failure to diagnose a condition, the possible need to convert to an open procedure, and creating a complication requiring transfusion or operation were discussed with the patient. The likelihood of improving the patient's symptoms with return to their baseline status is good.  The patient and/or family concurred with the proposed plan, giving informed consent. The site of surgery properly noted. The patient was taken to Operating Room, identified as [name] and the procedure verified as Laparoscopic Cholecystectomy with Intraoperative Cholangiogram. A Time Out was held and the above information confirmed.  Prior to the induction of general anesthesia, antibiotic prophylaxis was administered. General endotracheal anesthesia was then administered and tolerated well. After the induction, the abdomen was prepped with Chloraprep and draped in the sterile fashion. The patient was positioned in the supine position.  Local anesthetic agent was injected into the skin near the umbilicus and an incision made. We dissected down to the abdominal fascia with blunt dissection.  The fascia was incised vertically and we entered the peritoneal  cavity bluntly.  A pursestring suture of 0-Vicryl was placed around the fascial opening.  The Hasson cannula was inserted and secured with the stay suture.  Pneumoperitoneum was then created with CO2 and tolerated well without any adverse changes in the patient's vital signs. Upon insertion of the laparoscope there about to be a segment of small bowel tethered to the abdominal wall just to the left of the umbilicus. An 5-mm port was placed in the subxiphoid position.  Two 5-mm ports were placed in the right upper quadrant. All skin incisions were infiltrated with a local anesthetic agent before making the incision and placing the trocars.   I placed the laparoscope in the subxiphoid position to inspect the umbilical area. There was a small segment of small bowel tethered to the abdominal wall. The Hasson trocar was removed and the bowel was grasped with a babcock and the pursestring suture was then cut. The bowel was extracted thru the fascial defect and inspected. There was no full thickness suture needle defect. The area at most had a superficial serosal needle mark. The bowel was milked and no drainage of enteric contents was identified. I reinspected the area and again it was very superficial. I did elect to place two 3-0 silk lembert sutures over the area. The bowel lumen was not narrowed. i ran the bowel up and downstream for several inches and no other abnormality was noted. The bowel was returned to the abdomen and a new 0 vicryl pursestring suture was placed at the umbilical fascial defect and the 12 mm Hasson trocar was replaced.   We positioned the patient in reverse Trendelenburg, tilted slightly to the patient's left.  The gallbladder was identified, the fundus grasped and retracted cephalad. Adhesions were lysed bluntly and with the electrocautery where indicated, taking care not to injure any adjacent organs or viscus. The infundibulum was grasped and retracted  laterally, exposing the peritoneum  overlying the triangle of Calot. This was then divided and exposed in a blunt fashion. A critical view of the cystic duct and cystic artery was obtained.  The cystic duct was clearly identified and bluntly dissected circumferentially. The cystic duct was ligated with a clip distally.   An incision was made in the cystic duct and the [facility] cholangiogram catheter was attempted to be introduced. Despite multiple attempts I was unable to cannulate the cystic duct, it appeared I kept hitting a valve. I decided not to perform the cholangiogram. The catheter was then removed.   The cystic duct was then ligated with 3 clips on the biliary aspect and divided. The cystic artery was re- identified and ligated with clips and divided as well.   The gallbladder was dissected from the liver bed in retrograde fashion with the electrocautery. The gallbladder was removed and placed in an Endocatch sac.  The gallbladder and Endocatch sac were then removed through the umbilical port site. The liver bed was irrigated and inspected. Hemostasis was achieved with the electrocautery. Copious irrigation was utilized and was repeatedly aspirated until clear.  The pursestring suture was used to close the umbilical fascia.    We again inspected the right upper quadrant for hemostasis.  The umbilical closure was inspected and there was no air leak and nothing trapped within the closure. I ran the small bowel with atraumatic graspers for a short segment and it appeared normal.  Pneumoperitoneum was released as we removed the trocars.  4-0 Monocryl was used to close the skin.   Benzoin, steri-strips, and clean dressings were applied. The patient was then extubated and brought to the recovery room in stable condition. Instrument, sponge, and needle counts were correct at closure and at the conclusion of the case.   Findings: Chronic Cholecystitis with Cholelithiasis  Estimated Blood Loss: Minimal         Drains: none          Specimens: Gallbladder           Complications: None; patient tolerated the procedure well.         Disposition: PACU - hemodynamically stable.         Condition: stable  Kelly Brewer. Andrey Campanile, MD, FACS General, Bariatric, & Minimally Invasive Surgery [facility], [place]

## 2013-01-11 NOTE — Anesthesia Postprocedure Evaluation (Signed)
Anesthesia Post Note  Patient: Kelly Brewer  Procedure(s) Performed: Procedure(s) (LRB): LAPAROSCOPIC CHOLECYSTECTOMY WITH ATTEMPTED INTRAOPERATIVE CHOLANGIOGRAM (N/A)  Anesthesia type: General  Patient location: PACU  Post pain: Pain level controlled and Adequate analgesia  Post assessment: Post-op Vital signs reviewed, Patient's Cardiovascular Status Stable, Respiratory Function Stable, Patent Airway and Pain level controlled  Last Vitals:  Filed Vitals:   01/11/13 1215  BP:   Pulse: 62  Temp:   Resp: 12    Post vital signs: Reviewed and stable  Level of consciousness: awake, alert  and oriented  Complications: No apparent anesthesia complications

## 2013-01-11 NOTE — Transfer of Care (Signed)
Immediate Anesthesia Transfer of Care Note  Patient: Kelly Brewer  Procedure(s) Performed: Procedure(s): LAPAROSCOPIC CHOLECYSTECTOMY WITH ATTEMPTED INTRAOPERATIVE CHOLANGIOGRAM (N/A)  Patient Location: PACU  Anesthesia Type:General  Level of Consciousness: sedated, patient cooperative and responds to stimulation  Airway & Oxygen Therapy: Patient Spontanous Breathing and Patient connected to nasal cannula oxygen  Post-op Assessment: Report given to PACU RN, Post -op Vital signs reviewed and stable and Patient moving all extremities  Post vital signs: Reviewed and stable  Complications: No apparent anesthesia complications

## 2013-01-15 ENCOUNTER — Encounter (HOSPITAL_COMMUNITY): Payer: Self-pay | Admitting: General Surgery

## 2013-02-01 ENCOUNTER — Ambulatory Visit (INDEPENDENT_AMBULATORY_CARE_PROVIDER_SITE_OTHER): Payer: 59 | Admitting: General Surgery

## 2013-02-01 ENCOUNTER — Encounter (INDEPENDENT_AMBULATORY_CARE_PROVIDER_SITE_OTHER): Payer: Self-pay | Admitting: General Surgery

## 2013-02-01 VITALS — BP 138/86 | HR 80 | Temp 98.0°F | Resp 16 | Ht 64.0 in | Wt 150.2 lb

## 2013-02-01 DIAGNOSIS — Z09 Encounter for follow-up examination after completed treatment for conditions other than malignant neoplasm: Secondary | ICD-10-CM

## 2013-02-01 NOTE — Patient Instructions (Signed)
Can resume full activities Can try Miralax for constipation. It is not habit forming and you can take it daily as needed for constipation

## 2013-02-01 NOTE — Progress Notes (Signed)
Subjective:     Patient ID: Kelly ProvostEvelyn A Gadegbeku, female   DOB: 08/20/1960, 53 y.o.   MRN: [id number]  HPI 53 year old African female comes in for followup. She underwent laparoscopic cholecystectomy on December 12. She states that she is doing great. She is accompanied by her husband. She states that the symptoms she had preoperatively has resolved. She denies any fever, chills, nausea, vomiting, diarrhea or constipation. She states that her stools are hard. She reports a good appetite  Review of Systems     Objective:   Physical Exam BP 138/86  Pulse 80  Temp(Src) 98 F (36.7 C) (Temporal)  Resp 16  Ht 5\' 4"  (1.626 m)  Wt 150 lb 3.2 oz (68.13 kg)  BMI 25.77 kg/m2  Gen: alert, NAD, non-toxic appearing Pupils: equal, no scleral icterus Pulm: Lungs clear to auscultation, symmetric chest rise CV: regular rate and rhythm Abd: soft, nontender, nondistended. Well-healed trocar sites. No cellulitis. No incisional hernia Skin: no rash, no jaundice     Assessment:     S/p lap cholecystectomy     Plan:     She appears to be doing well. I gave a copy of the pathology report to her which showed chronic cholecystitis with cholelithiasis. I advised her to drink plenty water and take a stool softener as needed for the hard stools. I have released her to full activities. Followup as needed  Mary Sellaric M. Andrey CampanileWilson, MD, FACS General, Bariatric, & Minimally Invasive Surgery [facility], [place]

## 2013-11-19 ENCOUNTER — Other Ambulatory Visit: Payer: Self-pay

## 2013-11-19 DIAGNOSIS — Z1231 Encounter for screening mammogram for malignant neoplasm of breast: Secondary | ICD-10-CM

## 2013-12-11 ENCOUNTER — Telehealth: Payer: Self-pay | Admitting: *Deleted

## 2013-12-11 NOTE — Telephone Encounter (Signed)
Patient's husband called on her behalf. Patient currently has the Nexplanon and had it placed 01-17-11. Patient is content with her Nexplanon. Patient's husband notified that per Dr. Tamela OddiJackson-Moore new information shows that the Nexplanon is good for 4 years and that she may leave it in until next year. Patient advise to call when she needs the appointment.

## 2013-12-19 ENCOUNTER — Ambulatory Visit
Admission: RE | Admit: 2013-12-19 | Discharge: 2013-12-19 | Disposition: A | Discharge status: Home / Self Care | Payer: 59 | Source: Ambulatory Visit

## 2013-12-19 ENCOUNTER — Encounter (INDEPENDENT_AMBULATORY_CARE_PROVIDER_SITE_OTHER): Payer: Self-pay

## 2013-12-19 DIAGNOSIS — Z1231 Encounter for screening mammogram for malignant neoplasm of breast: Secondary | ICD-10-CM

## 2014-06-04 ENCOUNTER — Telehealth: Payer: Self-pay | Admitting: Certified Nurse Midwife

## 2014-06-04 NOTE — Telephone Encounter (Signed)
Husband called about his wife's Nexplanon desiring to know when it should come out.  During the conversation expressed that his wife is having lots of heavy bleeding with dysmenorrhea for two weeks out of a month.  Encouraged him to bring his wife in for an appointment to be seen.  Nexplanon not due for removal before 01/01/16 will be 3 years.  Husband wants to make sure his wife is not pregnant.  I spent 15 minutes on the phone during this conversation with him.  Please schedule an appointment.    Kelly Brewer, CNM

## 2014-11-28 ENCOUNTER — Other Ambulatory Visit: Payer: Self-pay

## 2014-11-28 DIAGNOSIS — Z1231 Encounter for screening mammogram for malignant neoplasm of breast: Secondary | ICD-10-CM

## 2014-12-24 ENCOUNTER — Ambulatory Visit
Admission: RE | Admit: 2014-12-24 | Discharge: 2014-12-24 | Disposition: A | Discharge status: Home / Self Care | Payer: 59 | Source: Ambulatory Visit

## 2014-12-24 DIAGNOSIS — Z1231 Encounter for screening mammogram for malignant neoplasm of breast: Secondary | ICD-10-CM

## 2015-08-19 ENCOUNTER — Telehealth: Payer: Self-pay | Admitting: *Deleted

## 2015-08-19 NOTE — Telephone Encounter (Signed)
See phone note for this encounter. 

## 2015-08-28 ENCOUNTER — Other Ambulatory Visit: Payer: Self-pay | Admitting: Internal Medicine

## 2015-08-28 DIAGNOSIS — Z1231 Encounter for screening mammogram for malignant neoplasm of breast: Secondary | ICD-10-CM

## 2015-09-08 ENCOUNTER — Ambulatory Visit: Payer: Self-pay | Admitting: Obstetrics & Gynecology

## 2015-10-13 ENCOUNTER — Encounter: Payer: Self-pay | Admitting: Certified Nurse Midwife

## 2015-10-13 ENCOUNTER — Ambulatory Visit (INDEPENDENT_AMBULATORY_CARE_PROVIDER_SITE_OTHER): Payer: 59 | Admitting: Certified Nurse Midwife

## 2015-10-13 VITALS — BP 143/87 | HR 83 | Temp 99.4°F | Wt 152.8 lb

## 2015-10-13 DIAGNOSIS — Z01419 Encounter for gynecological examination (general) (routine) without abnormal findings: Secondary | ICD-10-CM

## 2015-10-13 DIAGNOSIS — I10 Essential (primary) hypertension: Secondary | ICD-10-CM

## 2015-10-13 DIAGNOSIS — N90813 Female genital mutilation Type III status: Secondary | ICD-10-CM

## 2015-10-13 DIAGNOSIS — N951 Menopausal and female climacteric states: Secondary | ICD-10-CM

## 2015-10-13 NOTE — Progress Notes (Signed)
Subjective:        Kelly Brewer is a 55 y.o. female here for a routine exam.  Current complaints: none.   Declines STD testing.  Reports hot flashes at night.     Personal health questionnaire:  Is patient Ashkenazi Jewish, have a family history of breast and/or ovarian cancer: no Is there a family history of uterine cancer diagnosed at age < 45, gastrointestinal cancer, urinary tract cancer, family member who is a Personnel officer syndrome-associated carrier: no Is the patient overweight and hypertensive, family history of diabetes, personal history of gestational diabetes, preeclampsia or PCOS: yes Is patient over 28, have PCOS,  family history of premature CHD under age 16, diabetes, smoke, have hypertension or peripheral artery disease:  no At any time, has a partner hit, kicked or otherwise hurt or frightened you?: no Over the past 2 weeks, have you felt down, depressed or hopeless?: no Over the past 2 weeks, have you felt little interest or pleasure in doing things?:no   Gynecologic History No LMP recorded. Patient has had an implant. Contraception: Nexplanon Last Pap: unknown. Results were: normal according to the patient Last mammogram: 12/29/14. Results were: normal  Obstetric History OB History  Gravida Para Term Preterm AB Living  2         3  SAB TAB Ectopic Multiple Live Births               # Outcome Date GA Lbr Len/2nd Weight Sex Delivery Anes PTL Lv  2 Gravida           1 Gravida             G2P3  Past Medical History:  Diagnosis Date  . Asthma   . GERD (gastroesophageal reflux disease)   . Hypertension     Past Surgical History:  Procedure Laterality Date  . CESAREAN SECTION    . CHOLECYSTECTOMY N/A 01/11/2013   Procedure: LAPAROSCOPIC CHOLECYSTECTOMY WITH ATTEMPTED INTRAOPERATIVE CHOLANGIOGRAM;  Surgeon: Atilano Ina, MD;  Location: [facility] OR;  Service: General;  Laterality: N/A;     Current Outpatient Prescriptions:  .  albuterol (PROVENTIL  HFA;VENTOLIN HFA) 108 (90 BASE) MCG/ACT inhaler, Inhale 2 puffs into the lungs every 6 (six) hours as needed for wheezing or shortness of breath., Disp: , Rfl:  .  amLODipine (NORVASC) 10 MG tablet, Take 10 mg by mouth daily., Disp: , Rfl:  .  etonogestrel (IMPLANON) 68 MG IMPL implant, Inject 1 each into the skin once., Disp: , Rfl:  Allergies  Allergen Reactions  . Other Other (See Comments)    AGENT: Camaquin REACTION: Nausea, weakness    Social History  Substance Use Topics  . Smoking status: Never Smoker  . Smokeless tobacco: Never Used  . Alcohol use No    No family history on file.    Review of Systems  Constitutional: negative for fatigue and weight loss Respiratory: negative for cough and wheezing Cardiovascular: negative for chest pain, fatigue and palpitations Gastrointestinal: negative for abdominal pain and change in bowel habits Musculoskeletal:negative for myalgias Neurological: negative for gait problems and tremors Behavioral/Psych: negative for abusive relationship, depression Endocrine: negative for temperature intolerance   Genitourinary:negative for abnormal menstrual periods, genital lesions, hot flashes, sexual problems and vaginal discharge Integument/breast: negative for breast lump, breast tenderness, nipple discharge and skin lesion(s)    Objective:       BP (!) 143/87   Pulse 83   Temp 99.4 F (37.4 C)   Wt 152  lb 12.8 oz (69.3 kg)   BMI 26.23 kg/m  General:   alert  Skin:   no rash or abnormalities  Lungs:   clear to auscultation bilaterally  Heart:   regular rate and rhythm, S1, S2 normal, no murmur, click, rub or gallop  Breasts:   normal without suspicious masses, skin or nipple changes or axillary nodes  Abdomen:  normal findings: no organomegaly, soft, non-tender and no hernia  Pelvis:  External genitalia: normal general appearance, infibulation Urinary system: urethral meatus normal and bladder without fullness, nontender Vaginal:  normal without tenderness, induration or masses, absent ruguae Cervix: normal appearance Adnexa: normal bimanual exam Uterus: anteverted and non-tender, normal size   Lab Review Urine pregnancy test Labs reviewed yes Radiologic studies reviewed no  50% of 30 min visit spent on counseling and coordination of care.   Assessment:    Healthy female exam.   Contraception management  ?perimenopausal   H/O female infibulation  Plan:    Education reviewed: calcium supplements, depression evaluation, low fat, low cholesterol diet, safe sex/STD prevention, self breast exams, skin cancer screening and weight bearing exercise. Contraception: Nexplanon. Follow up in: 1 month for Nexplanon removal. Up to date on mammogram   No orders of the defined types were placed in this encounter.  Orders Placed This Encounter  Procedures  . Estradiol  . FSH  . LH   Need to obtain previous records Possible management options include: menopausal nuva ring.   Follow up as needed.

## 2015-10-14 LAB — FOLLICLE STIMULATING HORMONE: FSH: 41.8 m[IU]/mL

## 2015-10-14 LAB — LUTEINIZING HORMONE: LH: 25.5 m[IU]/mL

## 2015-10-14 LAB — ESTRADIOL: Estradiol: 12.7 pg/mL

## 2015-10-15 ENCOUNTER — Telehealth: Payer: Self-pay | Admitting: *Deleted

## 2015-10-15 NOTE — Telephone Encounter (Signed)
Patient is aware that Nexplanon needs to be removed and that her hormone levels are normal.The Nuva Ring is an alternative and she wants to discuss her potions during her procedure.

## 2015-10-16 LAB — PAP IG AND HPV HIGH-RISK
HPV, high-risk: NEGATIVE
PAP SMEAR COMMENT: 0

## 2015-10-18 LAB — NUSWAB VG, CANDIDA 6SP
CANDIDA KRUSEI, NAA: NEGATIVE
CANDIDA LUSITANIAE, NAA: NEGATIVE
CANDIDA PARAPSILOSIS, NAA: NEGATIVE
Candida albicans, NAA: NEGATIVE
Candida glabrata, NAA: NEGATIVE
Candida tropicalis, NAA: NEGATIVE
Trich vag by NAA: NEGATIVE

## 2015-11-17 ENCOUNTER — Ambulatory Visit (INDEPENDENT_AMBULATORY_CARE_PROVIDER_SITE_OTHER): Payer: 59 | Admitting: Certified Nurse Midwife

## 2015-11-17 DIAGNOSIS — Z30017 Encounter for initial prescription of implantable subdermal contraceptive: Secondary | ICD-10-CM

## 2015-11-19 ENCOUNTER — Encounter: Payer: Self-pay | Admitting: Certified Nurse Midwife

## 2015-11-19 DIAGNOSIS — Z30017 Encounter for initial prescription of implantable subdermal contraceptive: Secondary | ICD-10-CM | POA: Insufficient documentation

## 2015-11-19 NOTE — Progress Notes (Signed)
Nexplanon Procedure Note    PROCEDURE: Nexplanon removal and placement; perimenopausal status.  Performing Provider: Orvilla Cornwallachelle Edona Schreffler CNM   Patient education prior to procedure, explained risk, benefits of Nexplanon, reviewed alternative options. Patient reported understanding. Gave consent to continue with procedure.  Patient had Nexplanon inserted in 2014. Desires removal today w/ reinsertion  PROCEDURE:  Pregnancy Text :  not indicated Site (check):      left arm         Sterile Preparation:   Betadinex3 Lot # [id number]  [id number]   [id number] Expiration Date 05/19    The patient's left arm was palpated and the implant device located. The area was prepped with Betadinex3. The distal end of the device was palpated and 1.5 cc of 1% lidocaine without epinephrine was injected. A 1.5 mm incision was made. Any fibrotic tissue was carefully dissected away using blunt and/or sharp dissection. The device was removed in an intact manner.   Insertion site was the same as the removal site. Nexplanon  was inserted subcutaneously.Needle was removed from the insertion site. Nexplanon capsule was palpated by provider and patient to assure satisfactory placement. Dressing applied.  Followup: The patient tolerated the procedure well without complications.  Standard post-procedure care is explained and return precautions are given.  Orvilla Cornwallachelle Tonja Jezewski CNM

## 2015-12-02 ENCOUNTER — Other Ambulatory Visit: Payer: Self-pay | Admitting: Internal Medicine

## 2015-12-02 DIAGNOSIS — E2839 Other primary ovarian failure: Secondary | ICD-10-CM

## 2015-12-10 ENCOUNTER — Ambulatory Visit
Admission: RE | Admit: 2015-12-10 | Discharge: 2015-12-10 | Disposition: A | Discharge status: Home / Self Care | Payer: 59 | Source: Ambulatory Visit | Attending: Internal Medicine | Admitting: Internal Medicine

## 2015-12-10 ENCOUNTER — Ambulatory Visit: Payer: 59

## 2015-12-10 DIAGNOSIS — E2839 Other primary ovarian failure: Secondary | ICD-10-CM

## 2015-12-10 DIAGNOSIS — Z1231 Encounter for screening mammogram for malignant neoplasm of breast: Secondary | ICD-10-CM

## 2015-12-28 ENCOUNTER — Ambulatory Visit: Payer: 59

## 2016-11-03 ENCOUNTER — Other Ambulatory Visit: Payer: Self-pay | Admitting: Internal Medicine

## 2016-11-03 DIAGNOSIS — Z1231 Encounter for screening mammogram for malignant neoplasm of breast: Secondary | ICD-10-CM

## 2017-01-20 ENCOUNTER — Ambulatory Visit
Admission: RE | Admit: 2017-01-20 | Discharge: 2017-01-20 | Disposition: A | Discharge status: Home / Self Care | Payer: 59 | Source: Ambulatory Visit | Attending: Internal Medicine | Admitting: Internal Medicine

## 2017-01-20 DIAGNOSIS — Z1231 Encounter for screening mammogram for malignant neoplasm of breast: Secondary | ICD-10-CM

## 2017-12-18 ENCOUNTER — Other Ambulatory Visit: Payer: Self-pay | Admitting: Internal Medicine

## 2017-12-18 DIAGNOSIS — Z1231 Encounter for screening mammogram for malignant neoplasm of breast: Secondary | ICD-10-CM

## 2018-01-30 ENCOUNTER — Ambulatory Visit
Admission: RE | Admit: 2018-01-30 | Discharge: 2018-01-30 | Disposition: A | Discharge status: Home / Self Care | Payer: 59 | Source: Ambulatory Visit | Attending: Internal Medicine | Admitting: Internal Medicine

## 2018-01-30 DIAGNOSIS — Z1231 Encounter for screening mammogram for malignant neoplasm of breast: Secondary | ICD-10-CM

## 2018-12-24 ENCOUNTER — Other Ambulatory Visit: Payer: Self-pay | Admitting: Internal Medicine

## 2018-12-24 DIAGNOSIS — Z1231 Encounter for screening mammogram for malignant neoplasm of breast: Secondary | ICD-10-CM

## 2019-01-11 ENCOUNTER — Encounter: Payer: Self-pay | Admitting: Nurse Practitioner

## 2019-01-11 ENCOUNTER — Other Ambulatory Visit: Payer: Self-pay

## 2019-01-11 ENCOUNTER — Ambulatory Visit (INDEPENDENT_AMBULATORY_CARE_PROVIDER_SITE_OTHER): Payer: 59 | Admitting: Nurse Practitioner

## 2019-01-11 VITALS — BP 173/92 | HR 97 | Wt 150.0 lb

## 2019-01-11 DIAGNOSIS — Z3046 Encounter for surveillance of implantable subdermal contraceptive: Secondary | ICD-10-CM | POA: Diagnosis not present

## 2019-01-11 NOTE — Progress Notes (Signed)
      GYNECOLOGY OFFICE PROCEDURE NOTE  Kelly Brewer is a 58 y.o. G3P0 here for Nexplanon removal.  Last pap smear was on 10-13-15 and was normal and negative for High Risk HPV. According to ASCCP guidelines, repeat pap is due in 2022.  No other gynecologic concerns. Has a PCP she sees regularly who manages all her other health concerns except for GYN concerns.  Has Had colonscopy and is scheduled for mammogram.  Declines flu and TDAP today.  Has never had a flu vaccine.  Nexplanon Removal Patient identified, informed consent performed, consent signed.   Appropriate time out taken. Nexplanon site identified.  Area prepped in usual sterile fashon. One ml of 1% lidocaine was used to anesthetize the area at the distal end of the implant and 3 cc Lidocane was added along the length of the nexplanon. A small stab incision was made right beside the implant on the distal portion.  The Nexplanon rod did not come spontaneously to the incision and fascia was released.  The Nexplanon rod was grasped using hemostats and removed without difficulty after discecting through scar tissue.  There was minimal blood loss. There were no complications.  Steri-strips were applied over the small incision.  A pressure bandage was applied to reduce any bruising.  The patient tolerated the procedure well and was given post procedure instructions.  Patient is likely menopausal at this time.  Advised to return to the office if she is having any problems with vaginal bleeding.  Earlie Server, RN, MSN, NP-BC Nurse Practitioner, [facility] for [employer], [place] [facility] 01/11/2019 10:21 AM

## 2019-02-15 ENCOUNTER — Other Ambulatory Visit: Payer: Self-pay

## 2019-02-15 ENCOUNTER — Ambulatory Visit
Admission: RE | Admit: 2019-02-15 | Discharge: 2019-02-15 | Disposition: A | Discharge status: Home / Self Care | Payer: 59 | Source: Ambulatory Visit | Attending: Internal Medicine | Admitting: Internal Medicine

## 2019-02-15 DIAGNOSIS — Z1231 Encounter for screening mammogram for malignant neoplasm of breast: Secondary | ICD-10-CM

## 2020-01-14 ENCOUNTER — Other Ambulatory Visit: Payer: Self-pay | Admitting: Nurse Practitioner

## 2020-01-15 ENCOUNTER — Ambulatory Visit (INDEPENDENT_AMBULATORY_CARE_PROVIDER_SITE_OTHER): Payer: 59 | Admitting: Nurse Practitioner

## 2020-01-15 ENCOUNTER — Other Ambulatory Visit: Payer: Self-pay

## 2020-01-15 ENCOUNTER — Other Ambulatory Visit (HOSPITAL_COMMUNITY)
Admission: RE | Admit: 2020-01-15 | Discharge: 2020-01-15 | Disposition: A | Discharge status: Home / Self Care | Payer: 59 | Source: Ambulatory Visit | Attending: Nurse Practitioner | Admitting: Nurse Practitioner

## 2020-01-15 ENCOUNTER — Encounter: Payer: Self-pay | Admitting: Nurse Practitioner

## 2020-01-15 VITALS — BP 174/95 | HR 77 | Wt 156.0 lb

## 2020-01-15 DIAGNOSIS — R1013 Epigastric pain: Secondary | ICD-10-CM | POA: Insufficient documentation

## 2020-01-15 DIAGNOSIS — Z01419 Encounter for gynecological examination (general) (routine) without abnormal findings: Secondary | ICD-10-CM

## 2020-01-15 DIAGNOSIS — I10 Essential (primary) hypertension: Secondary | ICD-10-CM | POA: Diagnosis not present

## 2020-01-15 DIAGNOSIS — Z1211 Encounter for screening for malignant neoplasm of colon: Secondary | ICD-10-CM | POA: Insufficient documentation

## 2020-01-15 NOTE — Progress Notes (Signed)
Patient presents for Annual Exam today.  Last Pap: 10/13/2015 Neg HPV Mammogram:  02/15/2019 WNL  Family Hx of Breast Cancer: None    CC: None

## 2020-01-15 NOTE — Progress Notes (Signed)
GYNECOLOGY ANNUAL PREVENTATIVE CARE ENCOUNTER NOTE  Subjective:   Kelly Brewer is a 59 y.o. G2P0 female here for a routine annual gynecologic exam.  Current complaints: none.   Denies abnormal vaginal bleeding, discharge, pelvic pain, problems with intercourse or other gynecologic concerns.    Gynecologic History No LMP recorded. Patient has had an implant. Removed last year, Contraception: post menopausal status Last Pap: 2017. Results were: normal Last mammogram: 02-2019. Results were: normal  Obstetric History OB History  Gravida Para Term Preterm AB Living  2         3  SAB IAB Ectopic Multiple Live Births               # Outcome Date GA Lbr Len/2nd Weight Sex Delivery Anes PTL Lv  2 Gravida           1 Gravida             Obstetric Comments  T[facility] Boys     Past Medical History:  Diagnosis Date  . Asthma   . GERD (gastroesophageal reflux disease)   . Hypertension     Past Surgical History:  Procedure Laterality Date  . CESAREAN SECTION    . CHOLECYSTECTOMY N/A 01/11/2013   Procedure: LAPAROSCOPIC CHOLECYSTECTOMY WITH ATTEMPTED INTRAOPERATIVE CHOLANGIOGRAM;  Surgeon: Atilano Ina, MD;  Location: [facility] OR;  Service: General;  Laterality: N/A;    Current Outpatient Medications on File Prior to Visit  Medication Sig Dispense Refill  . albuterol (PROVENTIL HFA;VENTOLIN HFA) 108 (90 BASE) MCG/ACT inhaler Inhale 2 puffs into the lungs every 6 (six) hours as needed for wheezing or shortness of breath.    Marland Kitchen amLODipine (NORVASC) 10 MG tablet Take 10 mg by mouth daily.    Marland Kitchen etonogestrel (NEXPLANON) 68 MG IMPL implant Inject 1 each into the skin once. (Patient not taking: Reported on 01/15/2020)     No current facility-administered medications on file prior to visit.    Allergies  Allergen Reactions  . Other Other (See Comments)    AGENT: Camaquin REACTION: Nausea, weakness    Social History   Socioeconomic History  . Marital status: Married    Spouse name:  Not on file  . Number of children: Not on file  . Years of education: Not on file  . Highest education level: Not on file  Occupational History  . Not on file  Tobacco Use  . Smoking status: Never Smoker  . Smokeless tobacco: Never Used  Vaping Use  . Vaping Use: Never used  Substance and Sexual Activity  . Alcohol use: No  . Drug use: No  . Sexual activity: Yes    Partners: Male    Birth control/protection: None    Comment: Married   Other Topics Concern  . Not on file  Social History Narrative  . Not on file   Social Determinants of Health   Financial Resource Strain: Not on file  Food Insecurity: Not on file  Transportation Needs: Not on file  Physical Activity: Not on file  Stress: Not on file  Social Connections: Not on file  Intimate Partner Violence: Not on file    History reviewed. No pertinent family history.  The following portions of the patient's history were reviewed and updated as appropriate: allergies, current medications, past family history, past medical history, past social history, past surgical history and problem list.  Review of Systems Pertinent items noted in HPI and remainder of comprehensive ROS otherwise negative.   Objective:  Wt 156 lb (70.8 kg)   BMI 26.78 kg/m  CONSTITUTIONAL: Well-developed, well-nourished female in no acute distress.  HENT:  Normocephalic, atraumatic, External right and left ear normal.  EYES: Conjunctivae and EOM are normal. Pupils are equal, round.  No scleral icterus.  NECK: Normal range of motion, supple, no masses.  Normal thyroid.  SKIN: Skin is warm and dry. No rash noted. Not diaphoretic. No erythema. No pallor. NEUROLOGIC: Alert and oriented to person, place, and time. Normal reflexes, muscle tone coordination. No cranial nerve deficit noted. PSYCHIATRIC: Normal mood and affect. Normal behavior. Normal judgment and thought content. CARDIOVASCULAR: Normal heart rate noted, regular rhythm RESPIRATORY: Clear  to auscultation bilaterally. Effort and breath sounds normal, no problems with respiration noted. BREASTS: Symmetric in size. No masses, skin changes, nipple drainage, or lymphadenopathy. ABDOMEN: Soft, no distention noted.  No tenderness, rebound or guarding.  PELVIC: Normal appearing external genitalia, however introitus only admits long pediatric speculum.  Unable to visualize cervix.   No abnormal discharge noted.  Pap smear obtained.  One finger bimanual exam due to limited introitus.  Unable to palpate cervix or uterus due to very small vaginal opening, no other palpable masses, nol tenderness. MUSCULOSKELETAL: Normal range of motion. No tenderness.  No cyanosis, clubbing, or edema.    Assessment and Plan:  1. 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  High Blood pressure Taking medication and will follow up with primary care MD BP taken here after visit was completed.  Minimal discussion with client about BP but assured provider she is following up with her PCP.  Will follow up results of pap smear and manage accordingly. Mammogram already done this year. Advised to follow up with PCP for other vaccines, labs and to have BP be monitored. Routine preventative health maintenance measures emphasized. Please refer to After Visit Summary for other counseling recommendations.    Nolene Bernheim, RN, MSN, NP-BC Nurse Practitioner, [facility]

## 2020-01-17 LAB — CYTOLOGY - PAP
Adequacy: ABSENT
Comment: NEGATIVE
Diagnosis: NEGATIVE
High risk HPV: NEGATIVE

## 2020-01-29 ENCOUNTER — Other Ambulatory Visit: Payer: Self-pay | Admitting: Internal Medicine

## 2020-01-29 DIAGNOSIS — Z1231 Encounter for screening mammogram for malignant neoplasm of breast: Secondary | ICD-10-CM

## 2020-03-13 ENCOUNTER — Other Ambulatory Visit: Payer: Self-pay

## 2020-03-13 ENCOUNTER — Ambulatory Visit
Admission: RE | Admit: 2020-03-13 | Discharge: 2020-03-13 | Disposition: A | Discharge status: Home / Self Care | Payer: 59 | Source: Ambulatory Visit | Attending: Internal Medicine | Admitting: Internal Medicine

## 2020-03-13 DIAGNOSIS — Z1231 Encounter for screening mammogram for malignant neoplasm of breast: Secondary | ICD-10-CM

## 2020-12-03 ENCOUNTER — Other Ambulatory Visit: Payer: Self-pay | Admitting: Internal Medicine

## 2020-12-04 LAB — CBC
HCT: 41 % (ref 35.0–45.0)
Hemoglobin: 13.3 g/dL (ref 11.7–15.5)
MCH: 26.2 pg — ABNORMAL LOW (ref 27.0–33.0)
MCHC: 32.4 g/dL (ref 32.0–36.0)
MCV: 80.7 fL (ref 80.0–100.0)
MPV: 10.6 fL (ref 7.5–12.5)
Platelets: 232 10*3/uL (ref 140–400)
RBC: 5.08 10*6/uL (ref 3.80–5.10)
RDW: 13.3 % (ref 11.0–15.0)
WBC: 4.4 10*3/uL (ref 3.8–10.8)

## 2020-12-04 LAB — COMPLETE METABOLIC PANEL WITH GFR
AG Ratio: 1.3 (calc) (ref 1.0–2.5)
ALT: 16 U/L (ref 6–29)
AST: 22 U/L (ref 10–35)
Albumin: 4.2 g/dL (ref 3.6–5.1)
Alkaline phosphatase (APISO): 72 U/L (ref 37–153)
BUN: 10 mg/dL (ref 7–25)
CO2: 24 mmol/L (ref 20–32)
Calcium: 9.2 mg/dL (ref 8.6–10.4)
Chloride: 101 mmol/L (ref 98–110)
Creat: 0.89 mg/dL (ref 0.50–1.03)
Globulin: 3.2 g/dL (calc) (ref 1.9–3.7)
Glucose, Bld: 152 mg/dL — ABNORMAL HIGH (ref 65–99)
Potassium: 3.6 mmol/L (ref 3.5–5.3)
Sodium: 136 mmol/L (ref 135–146)
Total Bilirubin: 0.6 mg/dL (ref 0.2–1.2)
Total Protein: 7.4 g/dL (ref 6.1–8.1)
eGFR: 75 mL/min/{1.73_m2} (ref >=60)

## 2020-12-04 LAB — TSH: TSH: 0.91 mIU/L (ref 0.40–4.50)

## 2020-12-04 LAB — LIPID PANEL
Cholesterol: 214 mg/dL — ABNORMAL HIGH (ref <200)
HDL: 62 mg/dL (ref >=50)
LDL Cholesterol (Calc): 137 mg/dL (calc) — ABNORMAL HIGH
Non-HDL Cholesterol (Calc): 152 mg/dL (calc) — ABNORMAL HIGH (ref <130)
Total CHOL/HDL Ratio: 3.5 (calc) (ref <5.0)
Triglycerides: 59 mg/dL (ref <150)

## 2020-12-04 LAB — VITAMIN D 25 HYDROXY (VIT D DEFICIENCY, FRACTURES): Vit D, 25-Hydroxy: 34 ng/mL (ref 30–100)

## 2020-12-09 ENCOUNTER — Other Ambulatory Visit: Payer: Self-pay | Admitting: Internal Medicine

## 2020-12-09 DIAGNOSIS — E2839 Other primary ovarian failure: Secondary | ICD-10-CM

## 2021-01-26 ENCOUNTER — Ambulatory Visit
Admission: RE | Admit: 2021-01-26 | Discharge: 2021-01-26 | Disposition: A | Discharge status: Home / Self Care | Payer: 59 | Source: Ambulatory Visit | Attending: Internal Medicine | Admitting: Internal Medicine

## 2021-01-26 DIAGNOSIS — E2839 Other primary ovarian failure: Secondary | ICD-10-CM

## 2021-02-26 ENCOUNTER — Other Ambulatory Visit: Payer: Self-pay | Admitting: Internal Medicine

## 2021-02-26 DIAGNOSIS — Z1231 Encounter for screening mammogram for malignant neoplasm of breast: Secondary | ICD-10-CM

## 2021-03-15 ENCOUNTER — Ambulatory Visit: Payer: 59

## 2021-03-17 ENCOUNTER — Ambulatory Visit
Admission: RE | Admit: 2021-03-17 | Discharge: 2021-03-17 | Disposition: A | Discharge status: Home / Self Care | Payer: 59 | Source: Ambulatory Visit | Attending: Internal Medicine | Admitting: Internal Medicine

## 2021-03-17 ENCOUNTER — Other Ambulatory Visit: Payer: Self-pay

## 2021-03-17 DIAGNOSIS — Z1231 Encounter for screening mammogram for malignant neoplasm of breast: Secondary | ICD-10-CM

## 2021-05-04 ENCOUNTER — Other Ambulatory Visit: Payer: Self-pay | Admitting: Internal Medicine

## 2021-05-05 LAB — HCG, SERUM, QUALITATIVE: Preg, Serum: NEGATIVE

## 2021-05-05 LAB — EXTRA LAV TOP TUBE

## 2022-04-11 ENCOUNTER — Other Ambulatory Visit: Payer: Self-pay | Admitting: Internal Medicine

## 2022-04-11 DIAGNOSIS — Z1231 Encounter for screening mammogram for malignant neoplasm of breast: Secondary | ICD-10-CM

## 2022-04-29 ENCOUNTER — Ambulatory Visit: Payer: 59

## 2022-05-24 ENCOUNTER — Other Ambulatory Visit: Payer: Self-pay | Admitting: Internal Medicine

## 2022-05-25 LAB — COMPLETE METABOLIC PANEL WITH GFR
AG Ratio: 1.3 (calc) (ref 1.0–2.5)
ALT: 18 U/L (ref 6–29)
AST: 20 U/L (ref 10–35)
Albumin: 4.2 g/dL (ref 3.6–5.1)
Alkaline phosphatase (APISO): 69 U/L (ref 37–153)
BUN: 12 mg/dL (ref 7–25)
CO2: 27 mmol/L (ref 20–32)
Calcium: 9.5 mg/dL (ref 8.6–10.4)
Chloride: 105 mmol/L (ref 98–110)
Creat: 0.91 mg/dL (ref 0.50–1.03)
Globulin: 3.2 g/dL (calc) (ref 1.9–3.7)
Glucose, Bld: 88 mg/dL (ref 65–99)
Potassium: 4.3 mmol/L (ref 3.5–5.3)
Sodium: 139 mmol/L (ref 135–146)
Total Bilirubin: 0.4 mg/dL (ref 0.2–1.2)
Total Protein: 7.4 g/dL (ref 6.1–8.1)
eGFR: 73 mL/min/{1.73_m2} (ref >=60)

## 2022-05-25 LAB — CBC
HCT: 40.1 % (ref 35.0–45.0)
Hemoglobin: 13.2 g/dL (ref 11.7–15.5)
MCH: 26.2 pg — ABNORMAL LOW (ref 27.0–33.0)
MCHC: 32.9 g/dL (ref 32.0–36.0)
MCV: 79.7 fL — ABNORMAL LOW (ref 80.0–100.0)
MPV: 10.4 fL (ref 7.5–12.5)
Platelets: 257 10*3/uL (ref 140–400)
RBC: 5.03 10*6/uL (ref 3.80–5.10)
RDW: 14.1 % (ref 11.0–15.0)
WBC: 5.3 10*3/uL (ref 3.8–10.8)

## 2022-05-25 LAB — LIPID PANEL
Cholesterol: 219 mg/dL — ABNORMAL HIGH (ref <200)
HDL: 75 mg/dL (ref >=50)
LDL Cholesterol (Calc): 130 mg/dL (calc) — ABNORMAL HIGH
Non-HDL Cholesterol (Calc): 144 mg/dL (calc) — ABNORMAL HIGH (ref <130)
Total CHOL/HDL Ratio: 2.9 (calc) (ref <5.0)
Triglycerides: 58 mg/dL (ref <150)

## 2022-05-25 LAB — VITAMIN D 25 HYDROXY (VIT D DEFICIENCY, FRACTURES): Vit D, 25-Hydroxy: 32 ng/mL (ref 30–100)

## 2022-05-25 LAB — TSH: TSH: 2.23 mIU/L (ref 0.40–4.50)

## 2022-06-15 ENCOUNTER — Ambulatory Visit
Admission: RE | Admit: 2022-06-15 | Discharge: 2022-06-15 | Disposition: A | Discharge status: Home / Self Care | Payer: 59 | Source: Ambulatory Visit | Attending: Internal Medicine | Admitting: Internal Medicine

## 2022-06-15 DIAGNOSIS — Z1231 Encounter for screening mammogram for malignant neoplasm of breast: Secondary | ICD-10-CM

## 2023-05-25 ENCOUNTER — Other Ambulatory Visit: Payer: Self-pay | Admitting: Internal Medicine

## 2023-05-25 DIAGNOSIS — Z1231 Encounter for screening mammogram for malignant neoplasm of breast: Secondary | ICD-10-CM

## 2023-06-16 ENCOUNTER — Ambulatory Visit: Admitting: Physician Assistant

## 2023-06-16 ENCOUNTER — Ambulatory Visit
Admission: RE | Admit: 2023-06-16 | Discharge: 2023-06-16 | Disposition: A | Discharge status: Home / Self Care | Source: Ambulatory Visit | Attending: Internal Medicine | Admitting: Internal Medicine

## 2023-06-16 ENCOUNTER — Other Ambulatory Visit (HOSPITAL_COMMUNITY)
Admission: RE | Admit: 2023-06-16 | Discharge: 2023-06-16 | Disposition: A | Discharge status: Home / Self Care | Source: Ambulatory Visit | Attending: Physician Assistant | Admitting: Physician Assistant

## 2023-06-16 ENCOUNTER — Encounter: Payer: Self-pay | Admitting: Physician Assistant

## 2023-06-16 VITALS — BP 129/86 | HR 76 | Ht 63.0 in | Wt 156.2 lb

## 2023-06-16 DIAGNOSIS — Z124 Encounter for screening for malignant neoplasm of cervix: Secondary | ICD-10-CM | POA: Diagnosis not present

## 2023-06-16 DIAGNOSIS — Z1339 Encounter for screening examination for other mental health and behavioral disorders: Secondary | ICD-10-CM

## 2023-06-16 DIAGNOSIS — Z01419 Encounter for gynecological examination (general) (routine) without abnormal findings: Secondary | ICD-10-CM | POA: Insufficient documentation

## 2023-06-16 DIAGNOSIS — Z1231 Encounter for screening mammogram for malignant neoplasm of breast: Secondary | ICD-10-CM

## 2023-06-16 NOTE — Progress Notes (Signed)
 Pt presents for ROB visit.  Last PAP 01/2020 Requesting PAP today  Declines STD testing  Mammogram scheduled today

## 2023-06-16 NOTE — Progress Notes (Signed)
 ANNUAL EXAM Patient name: Kelly Brewer MRN [id number]  Date of birth: 18-Jan-1961 Chief Complaint:   No chief complaint on file.  History of Present Illness:   Kelly Brewer is a 63 y.o. G53P1003 female being seen today for a routine annual exam.   Current complaints: None  Patient's last menstrual period was 12/01/2012.  The pregnancy intention screening data noted above was reviewed. Potential methods of contraception were discussed. The patient elected to proceed with No data recorded.   Last pap 01/15/20 NILM, HPV neg.  H/O abnormal pap: no Last mammogram: 06/15/22, normal result. Family h/o breast cancer: no Last colonoscopy: Patient reports 4-5 years ago with Dr. Nickey Barn, normal result. No result in EMR. Family h/o colorectal cancer: no  GYNECOLOGIC HISTORY: Patient's last menstrual period was 12/01/2012.     06/16/2023    8:44 AM  Depression screen PHQ 2/9  Decreased Interest 0  Down, Depressed, Hopeless 0  PHQ - 2 Score 0  Altered sleeping 0  Tired, decreased energy 0  Change in appetite 0  Trouble concentrating 0  Moving slowly or fidgety/restless 0  Suicidal thoughts 0  PHQ-9 Score 0        06/16/2023    8:44 AM  GAD 7 : Generalized Anxiety Score  Nervous, Anxious, on Edge 0  Control/stop worrying 0  Worry too much - different things 0  Trouble relaxing 0  Restless 0  Easily annoyed or irritable 0  Afraid - awful might happen 0  Total GAD 7 Score 0    Review of Systems:   Pertinent items are noted in HPI Denies any headaches, blurred vision, fatigue, shortness of breath, chest pain, abdominal pain, abnormal vaginal discharge/itching/odor/irritation, problems with periods, bowel movements, urination, or intercourse unless otherwise stated above. Pertinent History Reviewed:  Reviewed past medical,surgical, social and family history.  Reviewed problem list, medications and allergies. Physical Assessment:   Vitals:   06/16/23 0836  BP: 129/86   Pulse: 76  Weight: 156 lb 3.2 oz (70.9 kg)  Height: 5\' 3"  (1.6 m)  Body mass index is 27.67 kg/m.        Physical Examination:   General appearance - well appearing, and in no distress  Mental status - alert, oriented to person, place, and time  Psych:  She has a normal mood and affect  Skin - warm and dry, normal color, no suspicious lesions noted  Chest - effort normal, all lung fields clear to auscultation bilaterally  Heart - normal rate and regular rhythm  Neck:  midline trachea, no thyromegaly or nodules  Breasts - breasts appear normal, no suspicious masses, no skin or nipple changes or  axillary nodes  Abdomen - soft, nontender, nondistended, no masses or organomegaly  Pelvic - VULVA: normal appearing vulva with no masses, tenderness or lesions  VAGINA: normal appearing vagina with normal color and discharge, no lesions  CERVIX: normal appearing cervix without discharge or lesions, no CMT  Thin prep pap is done with HR HPV cotesting  UTERUS: uterus is felt to be normal size, shape, consistency and nontender   ADNEXA: No adnexal masses or tenderness noted.  Extremities:  No swelling or varicosities noted  Chaperone present for exam  No results found for this or any previous visit (from the past 24 hours).  Assessment & Plan:  1. Encounter for annual routine gynecological examination (Primary) 2. Cervical cancer screening - Cervical cancer screening: Discussed guidelines. Pap with HPV updated. - Breast Health: Encouraged self breast  awareness/SBE. Discussed limits of clinical breast exam for detecting breast cancer. Discussed importance of annual MXR. Attending MXR later today. - Climacteric/Sexual health: Reviewed typical and atypical symptoms of menopause/peri-menopause. Discussed PMB and to call if any amount of spotting.  - Colonoscopy: No record in EMR/care everywhere. Patient reports normal result from 4-5 years ago with follow-up in 10 years. Discussed screening  guidelines and need for updated records. Pt to fill out request today. - F/U 12 months and prn  - Cytology - PAP( [facility])  No orders of the defined types were placed in this encounter.  Meds: No orders of the defined types were placed in this encounter.  Follow-up: No follow-ups on file.  Corianne Buccellato E Tuck Dulworth, PA-C 06/17/2023 9:58 AM

## 2023-06-21 ENCOUNTER — Ambulatory Visit: Payer: Self-pay | Admitting: Physician Assistant

## 2023-06-21 LAB — CYTOLOGY - PAP
Adequacy: ABSENT
Comment: NEGATIVE
Diagnosis: NEGATIVE
High risk HPV: NEGATIVE

## 2023-08-26 IMAGING — MG MM DIGITAL SCREENING BILAT W/ TOMO AND CAD
8 series · 8 of 24 positions shown · non-contrast
Comparison: Previous exam(s).

CLINICAL DATA: Screening.

EXAM:
DIGITAL SCREENING BILATERAL MAMMOGRAM WITH TOMOSYNTHESIS AND CAD
TECHNIQUE: Bilateral screening digital craniocaudal and mediolateral oblique
mammograms were obtained. Bilateral screening digital breast
tomosynthesis was performed. The images were evaluated with
computer-aided detection.

[L MLO synth-2D]
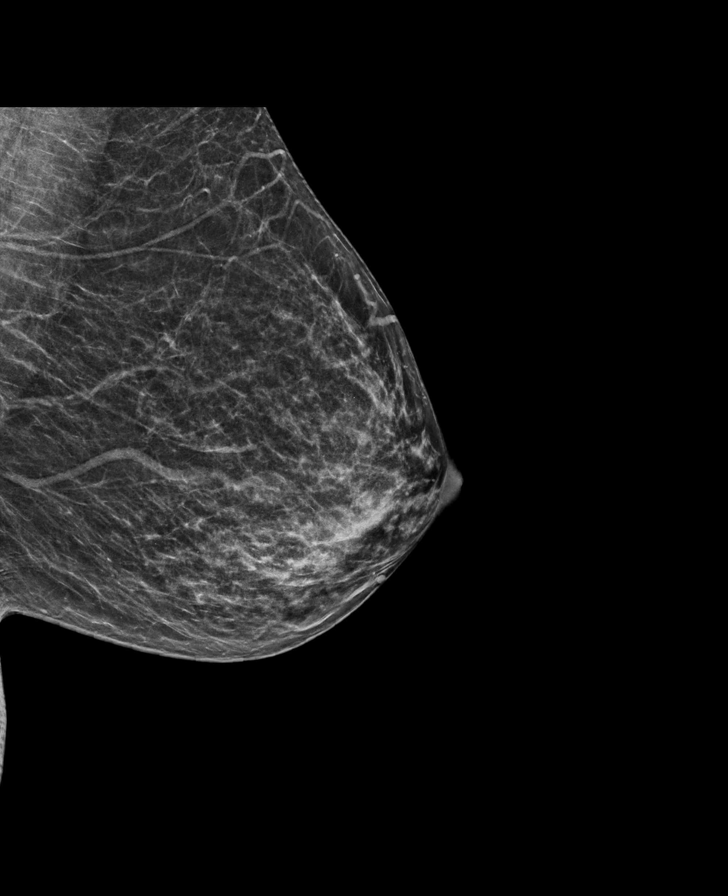

[R CC synth-2D]
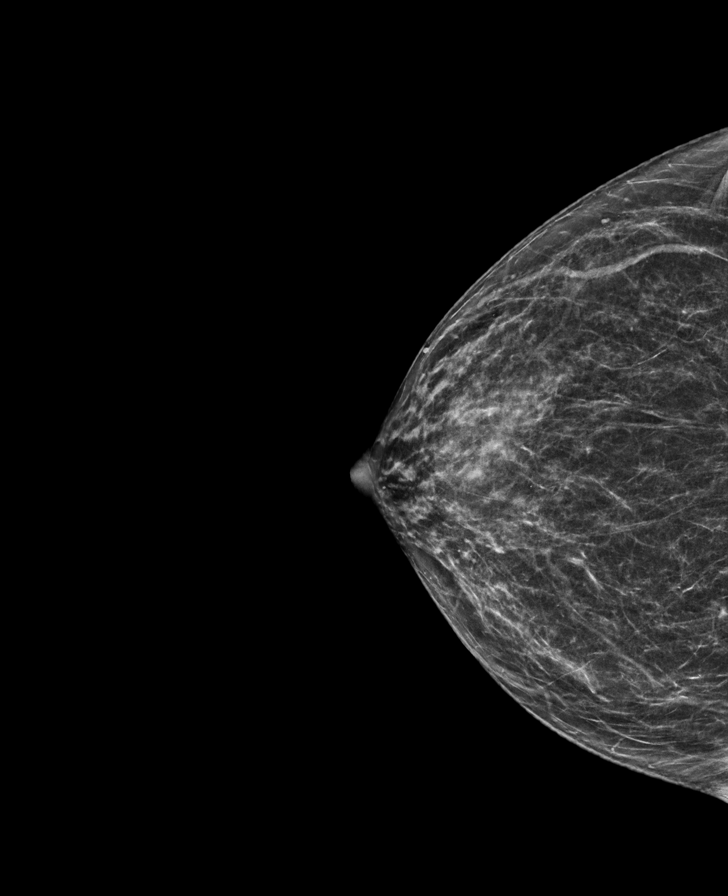

[L CC synth-2D]
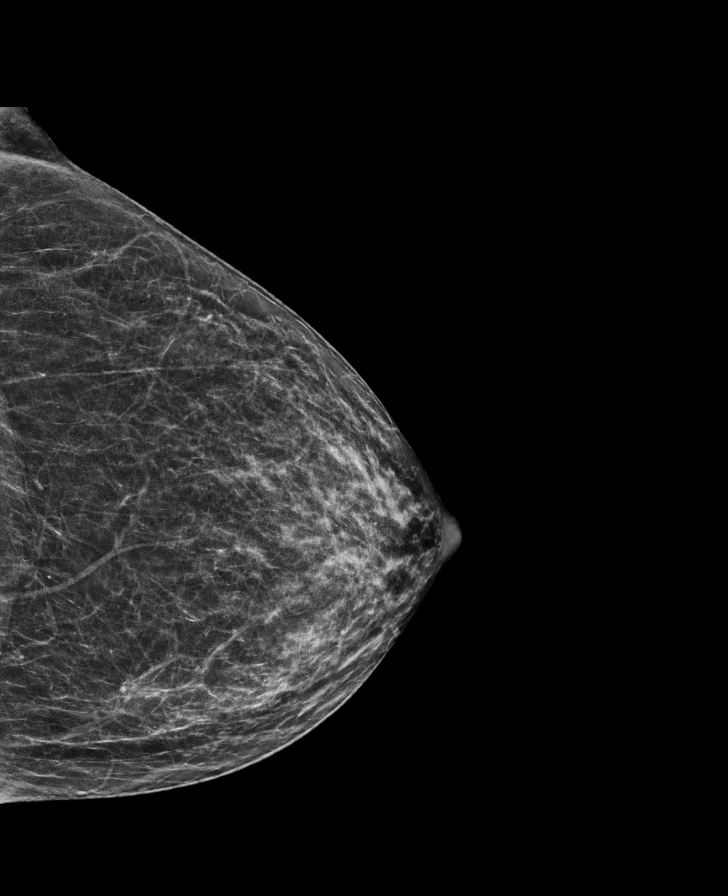

[R MLO synth-2D]
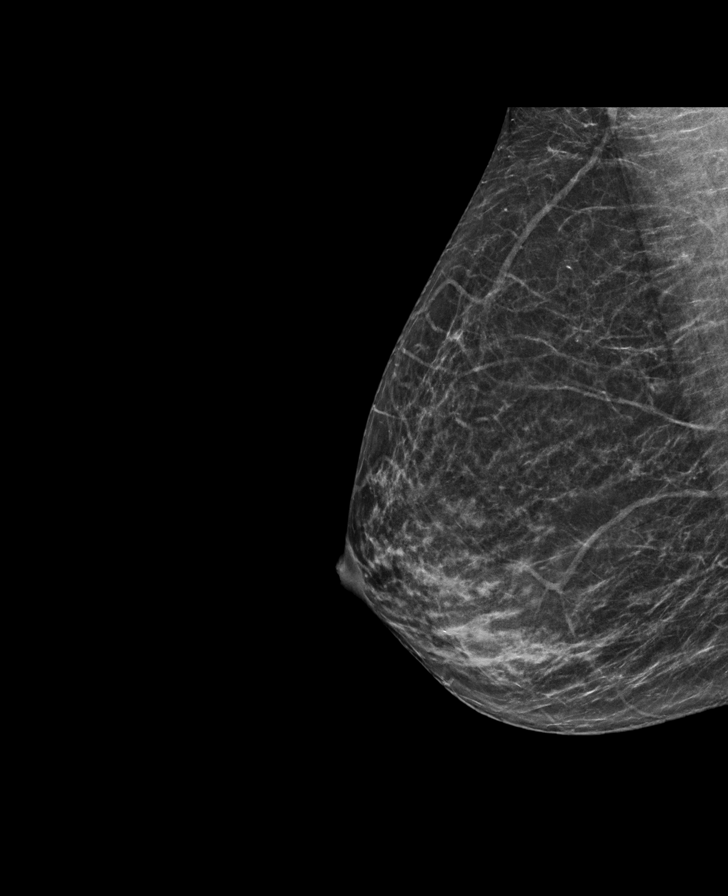

[R MLO tomo · tomo slice 29/58.0]
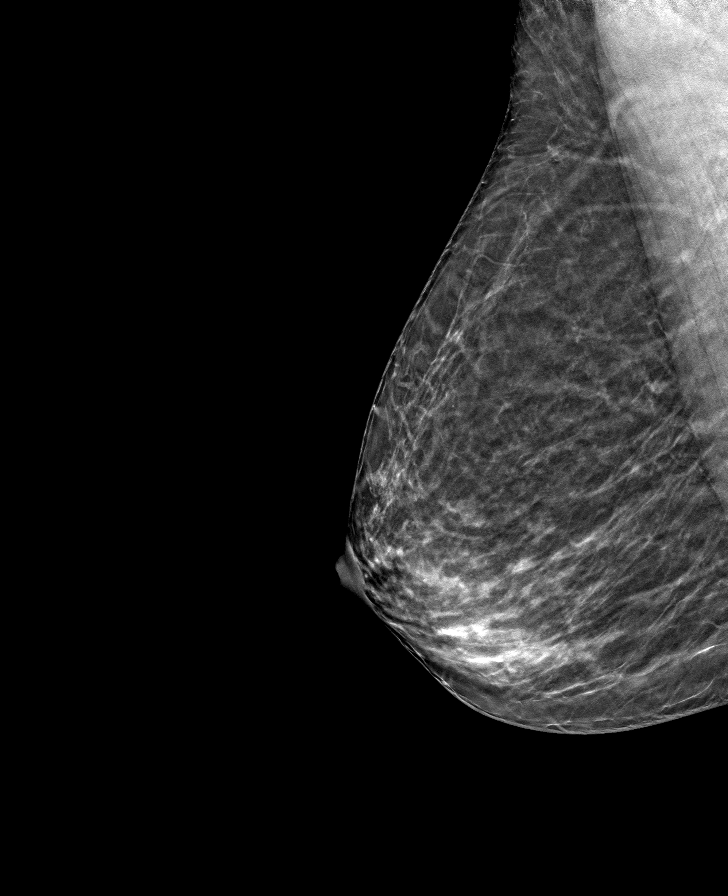

[L CC tomo · tomo slice 31/60.0]
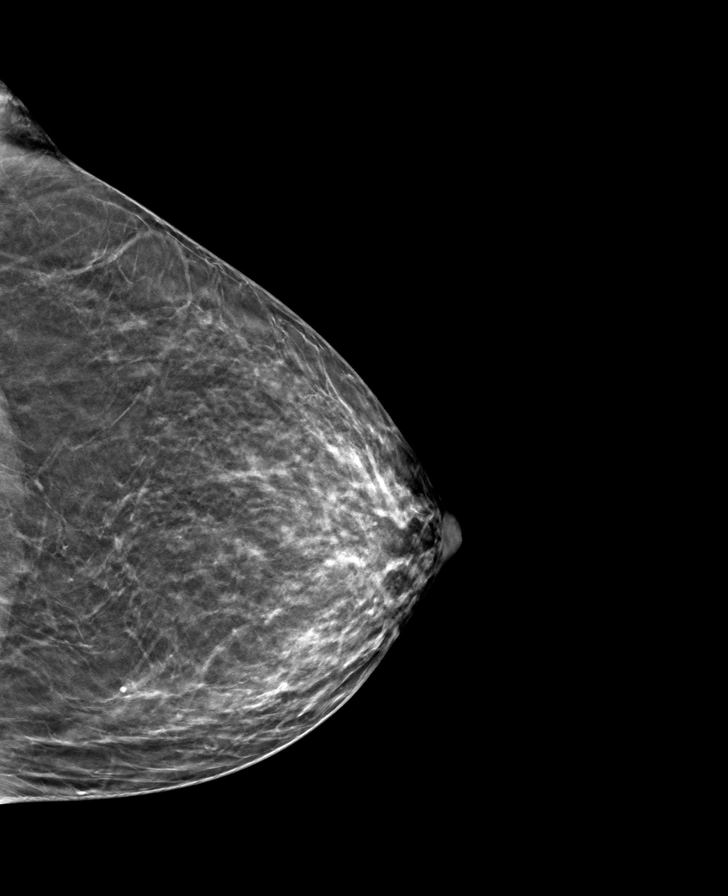

[L MLO tomo · tomo slice 30/59.0]
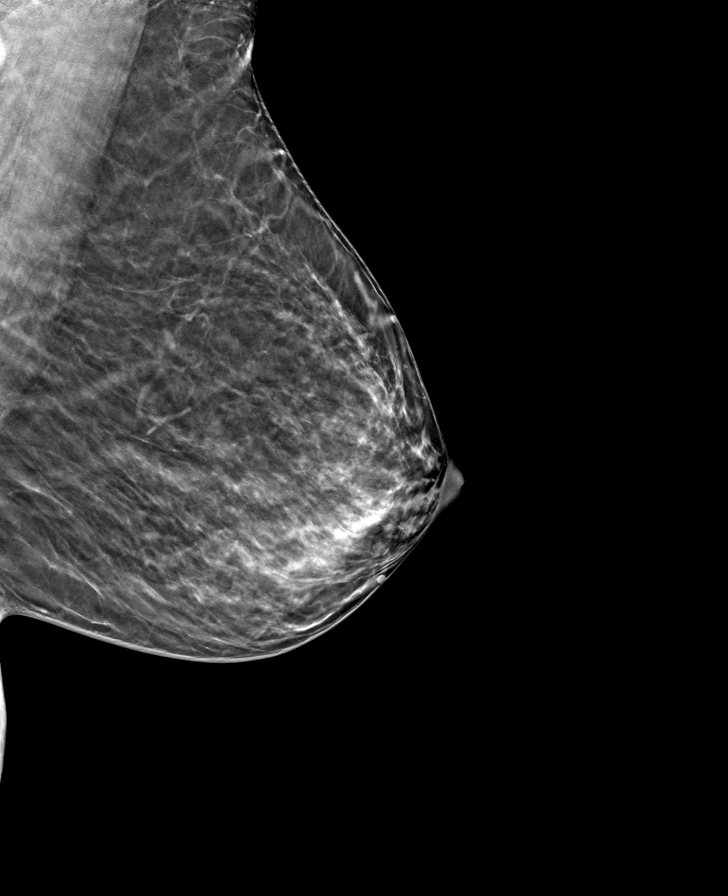

[R CC tomo · tomo slice 30/59.0]
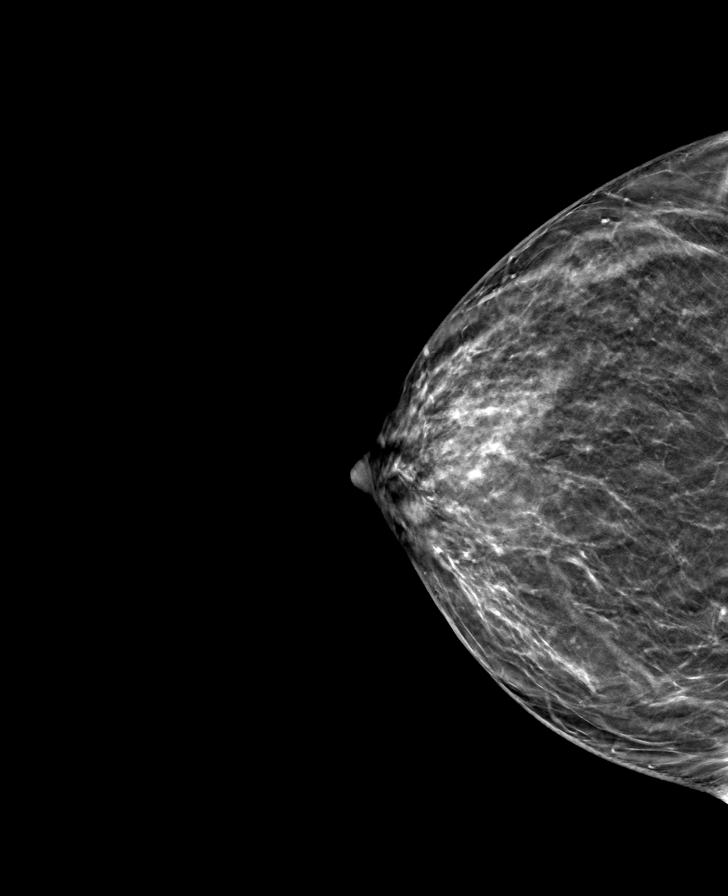

[8 of 24 positions shown; findings below may reference images not displayed]

ACR Breast Density Category b: There are scattered areas of
fibroglandular density.
FINDINGS: There are no findings suspicious for malignancy.
IMPRESSION: No mammographic evidence of malignancy. A result letter of this
screening mammogram will be mailed directly to the patient.

RECOMMENDATION:
Screening mammogram in one year. (Code:51-O-LD2)

BI-RADS CATEGORY  1: Negative.
